# Patient Record
Sex: Male | Born: 1960 | Race: White | Hispanic: No | Marital: Married | Smoking: Former smoker
Health system: Southern US, Community
[De-identification: ages and names within clinical notes are randomized; demographics above are authoritative.]

## PROBLEM LIST (undated history)

## (undated) DIAGNOSIS — R519 Headache, unspecified: Secondary | ICD-10-CM

## (undated) DIAGNOSIS — K219 Gastro-esophageal reflux disease without esophagitis: Secondary | ICD-10-CM

## (undated) DIAGNOSIS — J302 Other seasonal allergic rhinitis: Secondary | ICD-10-CM

## (undated) DIAGNOSIS — M1990 Unspecified osteoarthritis, unspecified site: Secondary | ICD-10-CM

## (undated) DIAGNOSIS — T7840XA Allergy, unspecified, initial encounter: Secondary | ICD-10-CM

## (undated) DIAGNOSIS — T884XXA Failed or difficult intubation, initial encounter: Secondary | ICD-10-CM

## (undated) DIAGNOSIS — G43909 Migraine, unspecified, not intractable, without status migrainosus: Secondary | ICD-10-CM

## (undated) DIAGNOSIS — J189 Pneumonia, unspecified organism: Secondary | ICD-10-CM

## (undated) DIAGNOSIS — R51 Headache: Secondary | ICD-10-CM

## (undated) HISTORY — DX: Headache: R51

## (undated) HISTORY — DX: Migraine, unspecified, not intractable, without status migrainosus: G43.909

## (undated) HISTORY — DX: Headache, unspecified: R51.9

## (undated) HISTORY — DX: Gastro-esophageal reflux disease without esophagitis: K21.9

## (undated) HISTORY — PX: SHOULDER ARTHROSCOPY WITH BICEPS TENDON REPAIR: SHX5674

## (undated) HISTORY — DX: Allergy, unspecified, initial encounter: T78.40XA

## (undated) HISTORY — DX: Other seasonal allergic rhinitis: J30.2

---
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
## 2004-03-19 ENCOUNTER — Ambulatory Visit: Payer: Self-pay | Admitting: Internal Medicine

## 2006-07-28 ENCOUNTER — Ambulatory Visit: Payer: Self-pay | Admitting: Internal Medicine

## 2006-08-06 ENCOUNTER — Ambulatory Visit: Payer: Self-pay | Admitting: Internal Medicine

## 2006-08-06 LAB — CONVERTED CEMR LAB
ALT: 19 units/L (ref 0–53)
AST: 21 units/L (ref 0–37)
Albumin: 4.2 g/dL (ref 3.5–5.2)
Alkaline Phosphatase: 51 units/L (ref 39–117)
BUN: 16 mg/dL (ref 6–23)
Basophils Absolute: 0 10*3/uL (ref 0.0–0.1)
Basophils Relative: 0.8 % (ref 0.0–1.0)
Bilirubin Urine: NEGATIVE
CO2: 29 meq/L (ref 19–32)
Calcium: 9.5 mg/dL (ref 8.4–10.5)
Chloride: 110 meq/L (ref 96–112)
Cholesterol: 201 mg/dL (ref 0–200)
Creatinine, Ser: 1.1 mg/dL (ref 0.4–1.5)
Direct LDL: 111.5 mg/dL
HDL: 61.7 mg/dL (ref >39.0)
Hemoglobin: 15.8 g/dL (ref 13.0–17.0)
Ketones, ur: NEGATIVE mg/dL
Leukocytes, UA: NEGATIVE
MCHC: 34.4 g/dL (ref 30.0–36.0)
Monocytes Relative: 11.2 % — ABNORMAL HIGH (ref 3.0–11.0)
Platelets: 243 10*3/uL (ref 150–400)
Potassium: 3.7 meq/L (ref 3.5–5.1)
RBC: 5.06 M/uL (ref 4.22–5.81)
RDW: 12.2 % (ref 11.5–14.6)
Specific Gravity, Urine: >=1.03 (ref 1.000–1.03)
Total Bilirubin: 1.4 mg/dL — ABNORMAL HIGH (ref 0.3–1.2)
Total CHOL/HDL Ratio: 3.3
Total Protein: 6.7 g/dL (ref 6.0–8.3)
VLDL: 15 mg/dL (ref 0–40)
pH: 6 (ref 5.0–8.0)

## 2006-10-15 ENCOUNTER — Ambulatory Visit: Payer: Self-pay | Admitting: Internal Medicine

## 2007-01-29 HISTORY — PX: CERVICAL DISCECTOMY: SHX98

## 2007-02-19 ENCOUNTER — Observation Stay (HOSPITAL_COMMUNITY): Admission: RE | Admit: 2007-02-19 | Discharge: 2007-02-20 | Payer: Self-pay | Admitting: *Deleted

## 2007-12-14 ENCOUNTER — Ambulatory Visit: Payer: Self-pay | Admitting: Internal Medicine

## 2007-12-14 DIAGNOSIS — R519 Headache, unspecified: Secondary | ICD-10-CM | POA: Insufficient documentation

## 2007-12-14 DIAGNOSIS — J309 Allergic rhinitis, unspecified: Secondary | ICD-10-CM | POA: Insufficient documentation

## 2007-12-14 DIAGNOSIS — R51 Headache: Secondary | ICD-10-CM

## 2008-02-03 ENCOUNTER — Ambulatory Visit: Payer: Self-pay | Admitting: Internal Medicine

## 2009-11-03 IMAGING — CR DG CERVICAL SPINE 2 OR 3 VIEWS
2 series · 2 of 2 positions shown · non-contrast
Comparison: 02/19/2007

CLINICAL DATA: Postop post cervical radiculopathy, ruptured cervical disk. 
 CERVICAL SPINE ? 2 VIEW:

[w c-spine lat]
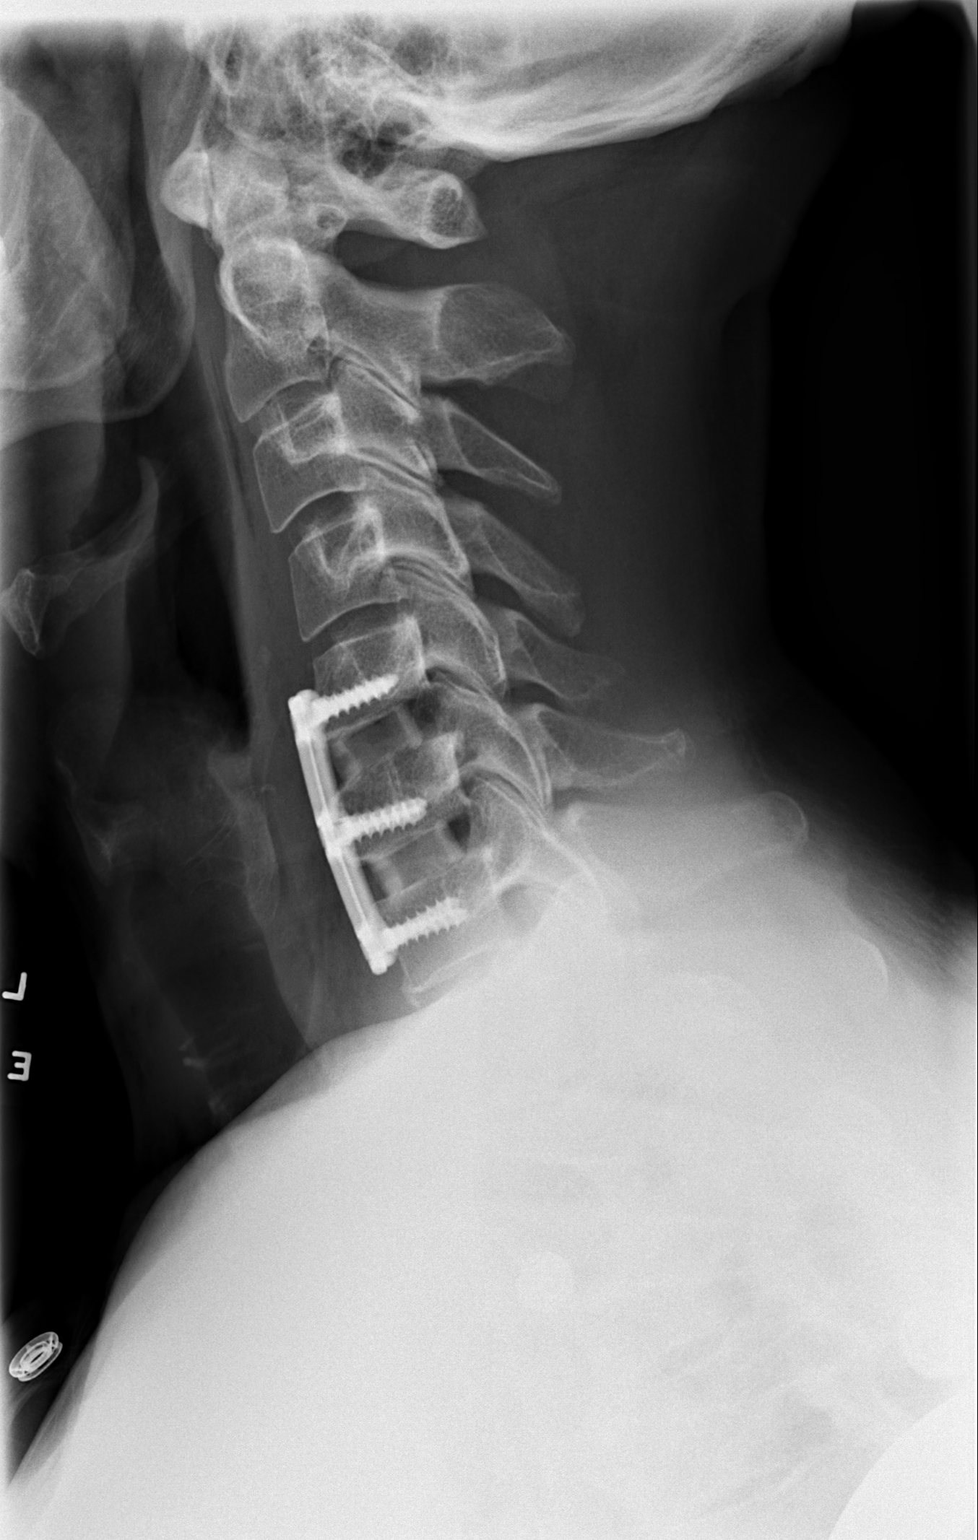

[w c-spine a.p.]
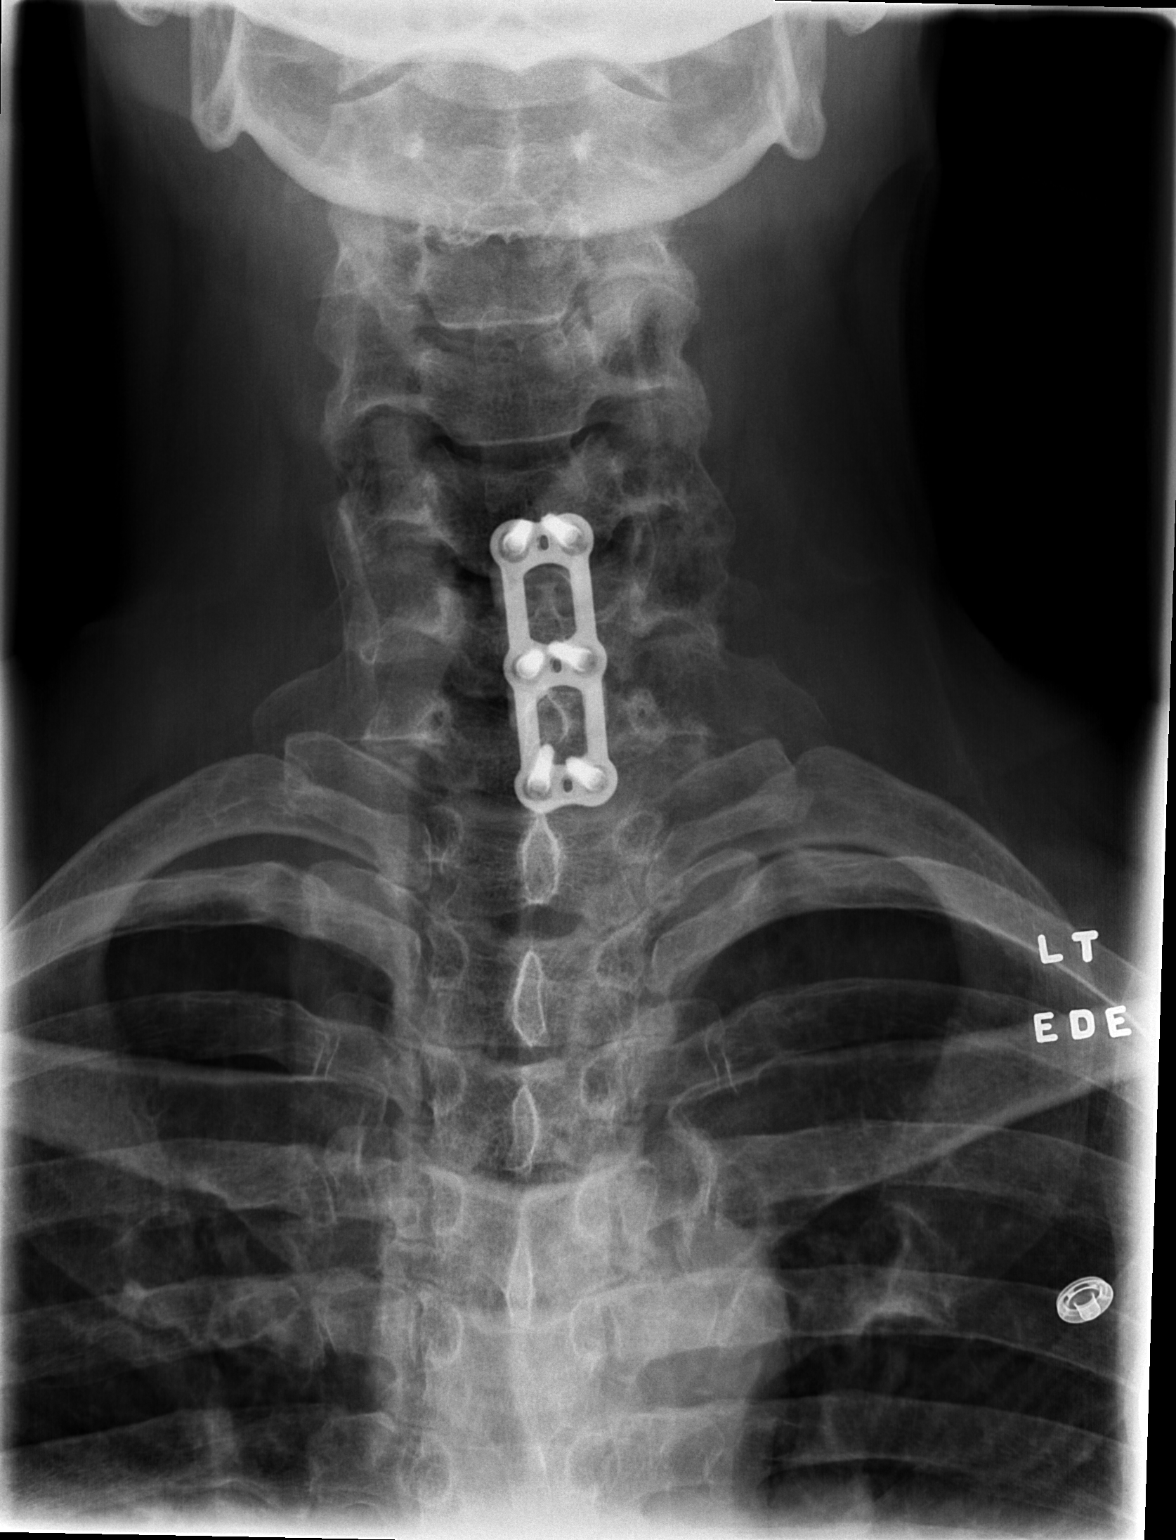

[2 of 2 positions shown; findings below may reference images not displayed]

FINDINGS: Two views of the cervical spine show no acute fracture or subluxation.   The patient is status post anterior metallic fixation of cervical spine from C5 to C7 with a metallic plate and screws noted.  Intervertebral bony spacers are noted in place.  
 The cervical airways appear patent.
IMPRESSION: Status post cervical spine anterior fixation from C5 to C7 with anterior metallic plate and metallic screws in place.    The disk spaces and alignment are preserved.

## 2010-06-12 NOTE — Assessment & Plan Note (Signed)
[facility]                           PRIMARY CARE OFFICE NOTE   NAME:Seth Robinson, Seth Robinson                     MRN:          [id number]  DATE:07/28/2006                            DOB:          [birth year]    CHIEF COMPLAINT:  New patient to the clinic.   HISTORY OF PRESENT ILLNESS:  The patient is a 50 year old white male  here to establish primary care. He last saw Dr.  Stevphen Rochester for  primary care. This was many years ago. He has not had any major chronic  medical problems. He reports periodic neck discomfort for which he has  been seeing a [facility] in [place]. Due to recent exacerbation of  symptoms, he was seen by Dr.  Barnett Abu, neurosurgeon, and he is  currently considering elective cervical fusion. He has been taking  Robaxin and Relafen for the last several weeks. This has improved his  symptoms. However, he has noted elevated blood pressure readings.   He denies any history of major illnesses or hospitalizations. That is,  no history of heart disease, type 2 diabetes.   He had a febrile illness in the early 80s thought to be tick fever or  [facility] spotted fever. He also has had occasional migraines and  remote tonsillectomy in 1975.   CURRENT MEDICATIONS:  1. Robaxin 750 mg every six hours.  2. Relafen 500 mg two tablets a day.   ALLERGIES:  None known.   SOCIAL HISTORY:  The patient is married for the last seven years. 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   FAMILY HISTORY:  Father was diagnosed with prostate cancer in 1966 and  underwent radical prostectomy and also cancer of his vas deferens with  one testicle removed/orchiectomy. Denies any family history of premature  heart disease. No type 2 diabetes.   HABITS:  Occasional alcohol. He averages 2-3 alcoholic beverages per  week. No tobacco use.   PREVENTATIVE CARE HISTORY:  Last tetanus was over 15 years ago. He has  not had  prostate cancer screening.   REVIEW OF SYSTEMS:  Occasional allergy symptoms. No chest pain or  shortness of breath. He does exercise or run 2-3 times per week. Denies  heartburn, nausea, vomiting, diarrhea or constipation. Denies erectile  dysfunction. Denies any dysuria, frequency or urgency. All other systems  negative.   PHYSICAL EXAMINATION:  VITAL SIGNS: Height 5 feet, 10 inches. Weight  195.8 pounds, temperature 98.5, pulse 67, blood pressure 137/84 in the  left arm.  In general, the patient is a pleasant, well-developed, well-nourished 20-  year-old white male who appears his stated age.  HEENT: Normocephalic, atraumatic. Pupils are equal and reactive to light  bilaterally. Extra-ocular muscles were intact. The patient was  anicteric. Conjunctivae was within normal limits. External auditory  canals and tympanic membranes were normal. Hearing is grossly normal.  Oropharyngeal examination was unremarkable.  NECK: Was supple. No adenopathy, carotid bruits or thyromegaly.  CHEST: Normal respiratory effort. Clear to auscultation bilaterally. No  rhonchi, rales or wheezing.  CARDIOVASCULAR: Regular rate and rhythm. No significant  murmurs, rubs or  gallops appreciated.  ABDOMEN: Soft and nontender. Positive bowel sounds. No organomegaly.  Digital rectal examination was deferred today.  MUSCULOSKELETAL: No clubbing, cyanosis or edema. The patient had  palpable pedis and dorsalis pulses.  NEUROLOGIC: Cranial nerves II-XII was grossly intact. He was nonfocal.   BASELINE EKG: Obtained in the office showed normal sinus rhythm at 60  beats per minute. The patient has right ventricular conduction delay. No  ST changes or Q-waves noted.   IMPRESSIONS/RECOMMENDATIONS:  1. Pre-hypertension.  2. History of cervical disc disease for possible elective cervical      fusion C5-C6 and C6-C7.  3. Chronic nonsteroidal anti-inflammatory use.  4. Family history of prostate cancer.  5. Health  maintenance.   RECOMMENDATIONS:  1. The patient was given samples of diclofenac gel and also diclofenac      patch. I prefer the patient use topical agents versus system      NSAIDS.  2. He was advised to monitor his blood pressure at home along with      following a low salt diet. If blood pressure is consistently in the      140s systolically, we discussed treating hypertension.  3. We deferred digital rectal examination and this will be performed      once a baseline PSA has been obtained. He will followup in one      month and review home blood pressure readings.  4. He was updated with tetanus booster today.  5. Would not recommend any other preoperative testing for his upcoming      cervical fusion.     Barbette Hair. Artist Pais, DO  Electronically Signed    RDY/MedQ  DD: 07/28/2006  DT: 07/28/2006  Job #: [id number]

## 2010-06-12 NOTE — Op Note (Signed)
NAME:  Seth Robinson, Seth Robinson              ACCOUNT NO.:  [id number]   MEDICAL RECORD NO.:  [id number]          PATIENT TYPE:  OBV   LOCATION:  3599                         FACILITY:  [facility]   PHYSICIAN:  Stefani Dama, M.D.  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   PROCEDURES:  Anterior cervical decompression, C5-6 and C6-7, arthrodesis  with structural allograft and Alphatec plate fixation.   SURGEON:  Stefani Dama, M.D.   FIRST ASSISTANT:  Hilda Lias, M.D.   ANESTHESIA:  General endotracheal.   INDICATIONS:  Seth Robinson is a 50 year old individual who has had  significant problems with neck, shoulder and arm pain in the right side  of his neck.  I initially evaluated this patient in May and noted that  he had advanced spondylitic changes with severe stenosis, particularly  for the C6 nerve root on the right side.  C7 was also involved with  spondylitic stenosis and there was evidence of cord compression at the  C6-C7 level.  After carefully advising him regarding surgery, the  patient wished to pursue further conservative management which was  essentially ineffective.  He is taken to the operating room now to  undergo surgical decompression.   PROCEDURE:  The patient was brought to the operating room and placed on  the table in the supine position.  After a smooth induction of general  endotracheal anesthesia, he was placed in 5 pounds of halter traction.  The neck was prepped with alcohol and DuraPrep and draped in a sterile  fashion.  Transverse incision was made in the left portion of the neck  and taken down through the platysma.  The  plane between the  sternocleidomastoid and the strap muscles was dissected bluntly until  the prevertebral space was reached.  The first identifiable disk space  was noted to be that of C5-C6 on a localizing radiograph.  Large ventral  osteophytes were encountered and these were carefully rongeured smooth.  The disk space was then opened.  It was noted that the disk space was  quite closed anteriorly secondary to the osteophytic overgrowth and a  high-speed bur and a 2.3 mm dissecting tool was used to drill down an  opening into the disk space and within the disk space.  Then a small  quantity of severely desiccated disk material was encountered at C5-C6.  Dissection was then taken down carefully through the disk space to the  region of the posterior longitudinal ligament.  A large overhanging  osteophyte from the inferior margin of the body of C5 was encountered,  and this was drilled down.  The dissection was then carried out to the  lateral aspect that drilled down the lateral osteophyte and the uncinate  process spur which had grown over this area.  There was  noted be severe  bony stenosis for the exiting C6 nerve root on that left side.  This was  carefully drilled down in a very stepwise fashion, all the time being  careful to protect the undersurface of the exiting foramen.  Once this  area was decompressed, attention was turned to the more central portion  of the canal where again the osteophytic material was encountered and  this was removed.  Most of the osteophyte was from the inferior margin  of the body of C5.  The left-sided foramen was then cleared.  This area  was much easier to clear as there was not much osteophytic overgrowth.  Once the interspace was cleared completely, the endplates were smoothed  with a 5 mm barrel bit and then an 8 mm transgraft was shaped and sized  to the appropriate configuration, filled with demineralized bone matrix  and then placed into the  interspace.  Attention was then turned to C6  and C7.  Retractors were repositioned into this area and again a ventral  osteophyte was encountered, not nearly as severe as the one at C5-6.  Nonetheless, this disk space also required some drilling to enter it  adequately, and once the disk space was opened, a moderate amount of  degenerated disk material was encountered within the disk space.  This  again was noted to be severely desiccated.  The endplates were cleared  of this material and the interspace was then opened further and again on  the right side mostly osteophytic overgrown material was found near the  region of the foramen.  This foramen was opened in the same very  stepwise fashion, drilling away the bone, and using a 1 and then 2 mm  Kerrison punch to gradually remove small spicules of bone and redundant  ligament to decompress the right-sided C7 nerve root.  Once this was  completed, the left side was decompressed in a similar fashion.  The  interspace was then smoothed with the 5 mm barrel bit, and again a  standard 8 mm graft was fashioned and fit into the interspace and filled  with demineralized bone matrix.  Traction was removed at this time and a  37 mm standard size Alphatec plate of the Trestle variety was placed and  secured with variable 4 mm x 14 mm  bone screws.  These were secured and  final localizing radiograph identified good position and surgical  construct.  Hemostasis was then carefully obtained in the soft tissues  and once this was verified, the platysma was closed with 3-0 Vicryl in  interrupted fashion, 3-0 Vicryl was used in the subcuticular tissue.  Blood loss was estimated at less than 50 mL.  The patient tolerated the  procedure well and was returned to the recovery room in stable  condition.      Stefani Dama, M.D.  Electronically Signed     HJE/MEDQ  D:  02/19/2007  T:  02/19/2007  Job:  [id number]

## 2010-10-18 LAB — CBC
HCT: 44.9
Hemoglobin: 15.4
MCHC: 34.2
RBC: 4.99

## 2012-12-28 ENCOUNTER — Encounter: Payer: Self-pay | Admitting: Family Medicine

## 2012-12-28 ENCOUNTER — Ambulatory Visit (INDEPENDENT_AMBULATORY_CARE_PROVIDER_SITE_OTHER): Payer: 59 | Admitting: Family Medicine

## 2012-12-28 VITALS — BP 120/80 | Temp 98.5°F | Ht 70.0 in | Wt 192.0 lb

## 2012-12-28 DIAGNOSIS — Z7689 Persons encountering health services in other specified circumstances: Secondary | ICD-10-CM

## 2012-12-28 DIAGNOSIS — R0609 Other forms of dyspnea: Secondary | ICD-10-CM

## 2012-12-28 DIAGNOSIS — R0683 Snoring: Secondary | ICD-10-CM

## 2012-12-28 DIAGNOSIS — Z7189 Other specified counseling: Secondary | ICD-10-CM

## 2012-12-28 DIAGNOSIS — M2240 Chondromalacia patellae, unspecified knee: Secondary | ICD-10-CM

## 2012-12-28 DIAGNOSIS — M2242 Chondromalacia patellae, left knee: Secondary | ICD-10-CM

## 2012-12-28 NOTE — Patient Instructions (Signed)
-  do exercises provided  -check with dentist about mouth pieces for snoring and aveo  -PLEASE SIGN UP FOR MYCHART TODAY   We recommend the following healthy lifestyle measures: - eat a healthy diet consisting of lots of vegetables, fruits, beans, nuts, seeds, healthy meats such as white chicken and fish and whole grains.  - avoid fried foods, fast food, processed foods, sodas, red meet and other fattening foods.  - get a least 150 minutes of aerobic exercise per week.   Follow up in: 1-2 months for physical

## 2012-12-28 NOTE — Progress Notes (Signed)
Pre visit review using our clinic review tool, if applicable. No additional management support is needed unless otherwise documented below in the visit note. 

## 2012-12-28 NOTE — Progress Notes (Signed)
Chief Complaint  Patient presents with  . Establish Care    HPI:  CARMIN DIBARTOLO is here to establish care.  Last PCP and physical:  Has the following chronic problems and concerns today:  Knee P Rt: -flares from time to time - reports it is runners knee -tenderness in R patellar tendon, lat patellar region worse with bending knee, running -denies: weakness, giving out, fevers, malasie, numbness  Snoring: -his whole life -no daytime somnolence, sleeps well, feels rested -wonders about mouth pieces  Patient Active Problem List   Diagnosis Date Noted  . ALLERGIC RHINITIS 12/14/2007  . HEADACHE, CHRONIC 12/14/2007   Health Maintenance: -had flu vaccine  ROS: See pertinent positives and negatives per HPI.  Past Medical History  Diagnosis Date  . Frequent headaches   . GERD (gastroesophageal reflux disease)   . Seasonal allergies   . Migraines     Family History  Problem Relation Age of Onset  . Lung cancer Maternal Grandfather   . Prostate cancer Father   . Heart disease Maternal Grandfather   . Kidney disease Paternal Uncle   . Testicular cancer Father     History   Social History  . Marital Status: Married    Spouse Name: N/A    Number of Children: N/A  . Years of Education: N/A   Social History Main Topics  . Smoking status: Never Smoker   . Smokeless tobacco: None  . Alcohol Use: Yes     Comment: 1 beer about 5 times during the week   . Drug Use: None  . Sexual Activity: None   Other Topics Concern  . 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  GENERAL: vitals reviewed and listed above, alert, oriented, appears well hydrated and in no acute distress  HEENT: atraumatic, conjunttiva clear, no obvious abnormalities on inspection of external nose and ears  NECK: no obvious masses on inspection  LUNGS: clear to auscultation bilaterally, no wheezes, rales or rhonchi, good air movement  CV: HRRR, no peripheral edema  MS: moves all extremities without noticeable abnormality -TTP patellar tendon mild, mild VMO atrophy, o/w normal knee exam  PSYCH: pleasant and cooperative, no obvious depression or anxiety  ASSESSMENT AND PLAN:  Discussed the following assessment and plan:  Runner's knee, left  Encounter to establish care  Snoring  -We reviewed the PMH, PSH, FH, SH, Meds and Allergies. -We provided refills for any medications we will prescribe as needed. -We addressed current concerns per orders and patient instructions. -We have asked for records for pertinent exams, studies, vaccines and notes from previous providers. -We have advised patient to follow up per instructions below. -follow up physical and follow up in 1-2 months   -Patient advised to return or notify a doctor immediately if symptoms worsen or persist or new concerns arise.  Patient  Instructions  -do exercises provided  -check with dentist about mouth pieces for snoring and aveo  -PLEASE SIGN UP FOR MYCHART TODAY   We recommend the following healthy lifestyle measures: - eat a healthy diet consisting of lots of vegetables, fruits, beans, nuts, seeds, healthy meats such as white chicken and fish and whole grains.  - avoid fried foods, fast food, processed foods, sodas, red meet and other fattening foods.  - get a least 150 minutes of aerobic exercise per week.   Follow up in: 1-2 months for physical      KIM, Dahlia Client  R.

## 2013-01-28 HISTORY — PX: COLONOSCOPY: SHX174

## 2013-03-03 ENCOUNTER — Encounter: Payer: Self-pay | Admitting: Family Medicine

## 2013-03-03 ENCOUNTER — Ambulatory Visit (INDEPENDENT_AMBULATORY_CARE_PROVIDER_SITE_OTHER): Payer: 59 | Admitting: Family Medicine

## 2013-03-03 VITALS — BP 128/86 | Temp 98.0°F | Wt 190.0 lb

## 2013-03-03 DIAGNOSIS — Z8042 Family history of malignant neoplasm of prostate: Secondary | ICD-10-CM

## 2013-03-03 DIAGNOSIS — Z1211 Encounter for screening for malignant neoplasm of colon: Secondary | ICD-10-CM

## 2013-03-03 DIAGNOSIS — Z0000 Encounter for general adult medical examination without abnormal findings: Secondary | ICD-10-CM

## 2013-03-03 DIAGNOSIS — M7710 Lateral epicondylitis, unspecified elbow: Secondary | ICD-10-CM

## 2013-03-03 DIAGNOSIS — M2240 Chondromalacia patellae, unspecified knee: Secondary | ICD-10-CM

## 2013-03-03 LAB — LIPID PANEL
CHOLESTEROL: 175 mg/dL (ref 0–200)
HDL: 69.3 mg/dL (ref >39.00)
LDL Cholesterol: 91 mg/dL (ref 0–99)
Total CHOL/HDL Ratio: 3
Triglycerides: 76 mg/dL (ref 0.0–149.0)
VLDL: 15.2 mg/dL (ref 0.0–40.0)

## 2013-03-03 LAB — HEMOGLOBIN A1C: HEMOGLOBIN A1C: 5.5 % (ref 4.6–6.5)

## 2013-03-03 LAB — PSA: PSA: 0.9 ng/mL (ref 0.10–4.00)

## 2013-03-03 NOTE — Patient Instructions (Signed)
-  We have ordered labs or studies at this visit. It can take up to 1-2 weeks for results and processing. We will contact you with instructions IF your results are abnormal. Normal results will be released to your [facility]. If you have not heard from us or can not find your results in [facility] in 2 weeks please contact our office.  -We placed a referral for you as discussed to the gastroenterologist for your colonoscopy. It usually takes about 1-2 weeks to process and schedule this referral. If you have not heard from us regarding this appointment in 2 weeks please contact our office.  -PLEASE SIGN UP FOR MYCHART TODAY   We recommend the following healthy lifestyle measures: - eat a healthy diet consisting of lots of vegetables, fruits, beans, nuts, seeds, healthy meats such as white chicken and fish and whole grains.  - avoid fried foods, fast food, processed foods, sodas, red meet and other fattening foods.  - get a least 150 minutes of aerobic exercise per week.   Follow up in: 1 year and as needed

## 2013-03-03 NOTE — Progress Notes (Signed)
Chief Complaint  Patient presents with  . Annual Exam    [facility]    HPI:  Here for his annual exam:  Runners knee: -had been doing exercises and improved and then stopped exercise -not running now  Tennis elbow: -trouble with this on and off and saw ortho in the past and had inj  ASA: low risks and no bleeding hx  Colon cancer screening: never had colon cancer screening  No hx HTN  Lipid/Diabetes screening: has not done in a long time, will screen today  STI screening/Hep C: not interested  Depression/substance abuse: none  Diet and exercise: not exercising now as mother had stroke and had been at hospital with her recently; diet is so so  Prostate cancer screening: pt asked about this, wants to check PSA after discussion risks and benefit as father had prostate ca at age 29   ROS: Review of Systems  Constitutional: Negative for fever and malaise/fatigue.  HENT: Positive for congestion. Negative for ear pain and hearing loss.   Eyes: Negative for blurred vision and double vision.  Respiratory: Negative for cough and shortness of breath.   Cardiovascular: Negative for chest pain and leg swelling.  Gastrointestinal: Negative for blood in stool and melena.  Genitourinary: Negative for dysuria.  Musculoskeletal: Negative for falls.  Skin: Negative for rash.  Neurological: Negative for dizziness and seizures.  Endo/Heme/Allergies: Does not bruise/bleed easily.  Psychiatric/Behavioral: Negative for depression. The patient does not have insomnia.      Past Medical History  Diagnosis Date  . Frequent headaches   . GERD (gastroesophageal reflux disease)   . Seasonal allergies   . Migraines     Past Surgical History  Procedure Laterality Date  . Cervical discectomy  Jan 2009    c5-c6,c6-c7  . Adenoidectomy      Family History  Problem Relation Age of Onset  . Lung cancer Maternal Grandfather   . Prostate cancer Father   . Heart  disease Maternal Grandfather   . Kidney disease Paternal Uncle   . Testicular cancer Father     History   Social History  . Marital Status: Married    Spouse Name: N/A    Number of Children: N/A  . Years of Education: N/A   Social History Main Topics  . Smoking status: Never Smoker   . Smokeless tobacco: None  . Alcohol Use: Yes     Comment: 1 beer about 5 times during the week   . Drug Use: None  . Sexual Activity: None   Other Topics Concern  . 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  GENERAL: vitals reviewed and listed above, alert, oriented, appears well hydrated and in no acute distress  HEENT: atraumatic, conjunttiva clear, no obvious abnormalities on inspection of external nose and ears  NECK: no  obvious masses on inspection  LUNGS: clear to auscultation bilaterally, no wheezes, rales or rhonchi, good air movement  CV: HRRR, no peripheral edema  MS: moves all extremities without noticeable abnormality  PSYCH: pleasant and cooperative, no obvious depression or anxiety  GU/Rectal: deferred  SKIN: no abnormal lesions  ASSESSMENT AND PLAN:  Discussed the following assessment and plan:  Colon cancer screening - Plan: Ambulatory referral to Gastroenterology  Encounter for preventive health examination - Plan: Lipid Panel, Hemoglobin A1c  Family hx of prostate cancer - Plan: PSA  Runner's knee  Tennis elbow  -discussed all USPSHTF level  A and B recommendations -FASTING labs today -advised to keep up on exercises for knee and ease back into running as pain has resolved -discussed options for tennis elbow and he may consider seeing sports med or ortho about this and will call -discussed risks and benefits of PSA per his request and he wishes to do this -recs and plan per orders and instructions  -Patient advised to return or notify a doctor immediately if symptoms worsen or persist or new concerns arise.  Patient Instructions  -We have ordered labs or studies at this visit. It can take up to 1-2 weeks for results and processing. We will contact you with instructions IF your results are abnormal. Normal results will be released to your [facility]. If you have not heard from us or can not find your results in [facility] in 2 weeks please contact our office.  -We placed a referral for you as discussed to the gastroenterologist for your colonoscopy. It usually takes about 1-2 weeks to process and schedule this referral. If you have not heard from us regarding this appointment in 2 weeks please contact our office.  -PLEASE SIGN UP FOR MYCHART TODAY   We recommend the following healthy lifestyle measures: - eat a healthy diet consisting of lots of vegetables, fruits, beans, nuts, seeds, healthy meats such as white chicken and fish and whole grains.  - avoid fried foods, fast food, processed foods, sodas, red meet and other fattening foods.  - get a least 150 minutes of aerobic exercise per week.   Follow up in: 1 year and as needed      Ashtyn Meland, Dahlia ClientHANNAH R.

## 2013-03-03 NOTE — Progress Notes (Signed)
Pre visit review using our clinic review tool, if applicable. No additional management support is needed unless otherwise documented below in the visit note. 

## 2013-03-18 ENCOUNTER — Encounter: Payer: Self-pay | Admitting: Gastroenterology

## 2013-04-28 ENCOUNTER — Ambulatory Visit (AMBULATORY_SURGERY_CENTER): Payer: Self-pay | Admitting: *Deleted

## 2013-04-28 VITALS — Ht 70.0 in | Wt 194.8 lb

## 2013-04-28 DIAGNOSIS — Z1211 Encounter for screening for malignant neoplasm of colon: Secondary | ICD-10-CM

## 2013-04-28 MED ORDER — MOVIPREP 100 G PO SOLR: ORAL | Status: DC

## 2013-04-28 NOTE — Progress Notes (Signed)
No allergies to eggs or soy. No problems with anesthesia.  Pt given Emmi instructions for colonoscopy  

## 2013-05-03 ENCOUNTER — Encounter: Payer: Self-pay | Admitting: Gastroenterology

## 2013-05-12 ENCOUNTER — Encounter: Payer: Self-pay | Admitting: Gastroenterology

## 2013-05-12 ENCOUNTER — Ambulatory Visit (AMBULATORY_SURGERY_CENTER): Payer: 59 | Admitting: Gastroenterology

## 2013-05-12 VITALS — BP 118/78 | HR 53 | Temp 97.9°F | Resp 12 | Ht 70.0 in | Wt 194.0 lb

## 2013-05-12 DIAGNOSIS — K644 Residual hemorrhoidal skin tags: Secondary | ICD-10-CM

## 2013-05-12 DIAGNOSIS — K5730 Diverticulosis of large intestine without perforation or abscess without bleeding: Secondary | ICD-10-CM

## 2013-05-12 DIAGNOSIS — Z1211 Encounter for screening for malignant neoplasm of colon: Secondary | ICD-10-CM

## 2013-05-12 MED ORDER — SODIUM CHLORIDE 0.9 % IV SOLN: 500.0000 mL | INTRAVENOUS | Status: DC

## 2013-05-12 NOTE — Op Note (Signed)
[facility] [address] [place], [id number]   COLONOSCOPY PROCEDURE REPORT  PATIENT: [name], Seth A.  MR#: [id number] BIRTHDATE: May 09, 1960 , 52  yrs. old GENDER: Male ENDOSCOPIST: Rachael Feeaniel P Jacobs, MD REFERRED XL:KGMWNUBY:Hannah Selena BattenKim, D.O. PROCEDURE DATE:  05/12/2013 PROCEDURE:   Colonoscopy, screening First Screening Colonoscopy - Avg.  risk and is 50 yrs.  old or older Yes.  Prior Negative Screening - Now for repeat screening. N/A  History of Adenoma - Now for follow-up colonoscopy & has been > or = to 3 yrs.  N/A  Polyps Removed Today? No.  Recommend repeat exam, <10 yrs? No. ASA CLASS:   Class II INDICATIONS:average risk screening. MEDICATIONS: MAC sedation, administered by CRNA and propofol (Diprivan) 350mg  IV  DESCRIPTION OF PROCEDURE:   After the risks benefits and alternatives of the procedure were thoroughly explained, informed consent was obtained.  A digital rectal exam revealed no abnormalities of the rectum.   The LB [id number]  endoscope was introduced through the anus and advanced to the cecum, which was identified by both the appendix and ileocecal valve. No adverse events experienced.   The quality of the prep was excellent.  The instrument was then slowly withdrawn as the colon was fully examined.   COLON FINDINGS: There were numerous diverticulum in the left colon. There were small external hemorrhoids.  The examination was otherwise normal.  Retroflexed views revealed no abnormalities. The time to cecum=6 minutes 48 seconds.  Withdrawal time=8 minutes 15 seconds.  The scope was withdrawn and the procedure completed. COMPLICATIONS: There were no complications.  ENDOSCOPIC IMPRESSION: There were numerous diverticulum in the left colon. There were small external hemorrhoids. The examination was otherwise normal.  No polyps or cancers  RECOMMENDATIONS: You should continue to follow colorectal cancer screening guidelines for "routine  risk" patients with a repeat colonoscopy in 10 years.    eSigned:  Rachael Feeaniel P Jacobs, MD 05/12/2013 9:56 AM

## 2013-05-12 NOTE — Patient Instructions (Signed)
Discharge instructions given with verbal understanding. Handouts on diverticulosis and hemorrhoids. Resume previous medications. YOU HAD AN ENDOSCOPIC PROCEDURE TODAY AT THE  ENDOSCOPY CENTER: Refer to the procedure report that was given to you for any specific questions about what was found during the examination.  If the procedure report does not answer your questions, please call your gastroenterologist to clarify.  If you requested that your care partner not be given the details of your procedure findings, then the procedure report has been included in a sealed envelope for you to review at your convenience later.  YOU SHOULD EXPECT: Some feelings of bloating in the abdomen. Passage of more gas than usual.  Walking can help get rid of the air that was put into your GI tract during the procedure and reduce the bloating. If you had a lower endoscopy (such as a colonoscopy or flexible sigmoidoscopy) you may notice spotting of blood in your stool or on the toilet paper. If you underwent a bowel prep for your procedure, then you may not have a normal bowel movement for a few days.  DIET: Your first meal following the procedure should be a light meal and then it is ok to progress to your normal diet.  A half-sandwich or bowl of soup is an example of a good first meal.  Heavy or fried foods are harder to digest and may make you feel nauseous or bloated.  Likewise meals heavy in dairy and vegetables can cause extra gas to form and this can also increase the bloating.  Drink plenty of fluids but you should avoid alcoholic beverages for 24 hours.  ACTIVITY: Your care partner should take you home directly after the procedure.  You should plan to take it easy, moving slowly for the rest of the day.  You can resume normal activity the day after the procedure however you should NOT DRIVE or use heavy machinery for 24 hours (because of the sedation medicines used during the test).    SYMPTOMS TO REPORT  IMMEDIATELY: A gastroenterologist can be reached at any hour.  During normal business hours, 8:30 AM to 5:00 PM Monday through Friday, call [phone].  After hours and on weekends, please call the GI answering service at [phone] who will take a message and have the physician on call contact you.   Following lower endoscopy (colonoscopy or flexible sigmoidoscopy):  Excessive amounts of blood in the stool  Significant tenderness or worsening of abdominal pains  Swelling of the abdomen that is new, acute  Fever of 100F or higher  FOLLOW UP: If any biopsies were taken you will be contacted by phone or by letter within the next 1-3 weeks.  Call your gastroenterologist if you have not heard about the biopsies in 3 weeks.  Our staff will call the home number listed on your records the next business day following your procedure to check on you and address any questions or concerns that you may have at that time regarding the information given to you following your procedure. This is a courtesy call and so if there is no answer at the home number and we have not heard from you through the emergency physician on call, we will assume that you have returned to your regular daily activities without incident.  SIGNATURES/CONFIDENTIALITY: You and/or your care partner have signed paperwork which will be entered into your electronic medical record.  These signatures attest to the fact that that the information above on your After Visit Summary   has been reviewed and is understood.  Full responsibility of the confidentiality of this discharge information lies with you and/or your care-partner. 

## 2013-05-12 NOTE — Progress Notes (Signed)
Lidocaine-40mg IV prior to Propofol InductionPropofol given over incremental dosages 

## 2013-05-13 ENCOUNTER — Telehealth: Payer: Self-pay

## 2013-05-13 NOTE — Telephone Encounter (Signed)
Called [phone] the phone rang numerous times but no answering machine picked up.  Unable to leave a message that we called. maw

## 2014-03-03 ENCOUNTER — Encounter: Payer: Self-pay | Admitting: Family Medicine

## 2014-03-03 ENCOUNTER — Ambulatory Visit (INDEPENDENT_AMBULATORY_CARE_PROVIDER_SITE_OTHER): Payer: 59 | Admitting: Family Medicine

## 2014-03-03 VITALS — BP 110/70 | HR 71 | Temp 98.3°F | Ht 69.5 in | Wt 196.5 lb

## 2014-03-03 DIAGNOSIS — Z0000 Encounter for general adult medical examination without abnormal findings: Secondary | ICD-10-CM

## 2014-03-03 LAB — LIPID PANEL
CHOLESTEROL: 172 mg/dL (ref 0–200)
HDL: 67.7 mg/dL (ref >39.00)
LDL CALC: 83 mg/dL (ref 0–99)
NonHDL: 104.3
TRIGLYCERIDES: 109 mg/dL (ref 0.0–149.0)
Total CHOL/HDL Ratio: 3
VLDL: 21.8 mg/dL (ref 0.0–40.0)

## 2014-03-03 LAB — HEMOGLOBIN A1C: HEMOGLOBIN A1C: 5.4 % (ref 4.6–6.5)

## 2014-03-03 NOTE — Progress Notes (Signed)
Pre visit review using our clinic review tool, if applicable. No additional management support is needed unless otherwise documented below in the visit note. 

## 2014-03-03 NOTE — Progress Notes (Signed)
HPI:  Here for CPE:  -Concerns and/or follow up today: none  -Diet: variety of foods, balance and well rounded, larger portion sizes  -Exercise: no regular exercise  -Diabetes and Dyslipidemia Screening: FASTING today  -Hx of HTN: no  -Vaccines: UTD  -sexual activity: yes, male partner, no new partners  -wants STI testing, Hep C screening (if born [id number]): no  -FH colon or prstate ca: see FH Last colon cancer screening: UTD Last prostate ca screening: done last year, offered after discussion risks benefits, opted to screen every 2 years with PSA per current data  -Alcohol, Tobacco, drug use: see social history  Review of Systems - no fevers, unintentional weight loss, vision loss, hearing loss, chest pain, sob, hemoptysis, melena, hematochezia, hematuria, genital discharge, changing or concerning skin lesions, bleeding, bruising, loc, thoughts of self harm or SI  Past Medical History  Diagnosis Date  . Frequent headaches   . GERD (gastroesophageal reflux disease)   . Seasonal allergies   . Migraines     Past Surgical History  Procedure Laterality Date  . Cervical discectomy  Jan 2009    c5-c6,c6-c7  . Adenoidectomy  1970    Family History  Problem Relation Age of Onset  . Lung cancer Maternal Grandfather   . Heart disease Maternal Grandfather   . Prostate cancer Father   . Testicular cancer Father   . Kidney disease Paternal Uncle   . Colon cancer Neg Hx     History   Social History  . Marital Status: Married    Spouse Name: N/A    Number of Children: N/A  . Years of Education: N/A   Social History Main Topics  . Smoking status: Never Smoker   . Smokeless tobacco: Never Used  . Alcohol Use: 3.0 oz/week    5 Cans of beer per week  . Drug Use: No  . Sexual Activity: None   Other Topics Concern  . None   Social History Narrative   Work or School: retired Building services engineerpolice officer      Home Situation: lives with wife      Spiritual Beliefs:  christian      Lifestyle: runs when warm; diet ok              Current outpatient prescriptions:  Marland Kitchen.  Multiple Vitamins-Minerals (GNP MEGA MULTI FOR MEN) TABS, Take by mouth., Disp: , Rfl:  .  Omega-3 Fatty Acids (FISH OIL) 1200 MG CAPS, Take by mouth daily., Disp: , Rfl:  .  triamcinolone (NASACORT ALLERGY 24HR) 55 MCG/ACT AERO nasal inhaler, Place 2 sprays into the nose daily., Disp: , Rfl:   EXAM:  Filed Vitals:   03/03/14 0848  BP: 110/70  Pulse: 71  Temp: 98.3 F (36.8 C)  TempSrc: Oral  Height: 5' 9.5" (1.765 m)  Weight: 196 lb 8 oz (89.132 kg)    Estimated body mass index is 28.61 kg/(m^2) as calculated from the following:   Height as of this encounter: 5' 9.5" (1.765 m).   Weight as of this encounter: 196 lb 8 oz (89.132 kg).  GENERAL: vitals reviewed and listed below, alert, oriented, appears well hydrated and in no acute distress  HEENT: head atraumatic, PERRLA, normal appearance of eyes, ears, nose and mouth. moist mucus membranes.  NECK: supple, no masses or lymphadenopathy  LUNGS: clear to auscultation bilaterally, no rales, rhonchi or wheeze  CV: HRRR, no peripheral edema or cyanosis, normal pedal pulses  ABDOMEN: bowel sounds normal, soft, non tender to palpation,  no masses, no rebound or guarding  GU: declined  RECTAL:  declined  SKIN: no rash or abnormal lesions  MS: normal gait, moves all extremities normally  NEURO: CN II-XII grossly intact, normal muscle strength and sensation to light touch on extremities  PSYCH: normal affect, pleasant and cooperative  ASSESSMENT AND PLAN:  Discussed the following assessment and plan:  There are no diagnoses linked to this encounter.  Visit for preventive health examination - Plan: Lipid panel, Hemoglobin A1c  -Discussed and advised all [place] preventive services health task force level A and B recommendations for age, sex and risks.  -Advised at least 150 minutes of exercise per week and a healthy  diet low in saturated fats and sweets and consisting of fresh fruits and vegetables, lean meats such as fish and white chicken and whole grains.  -FASTING labs, studies and vaccines per orders this encounter   Patient advised to return to clinic immediately if symptoms worsen or persist or new concerns.  There are no Patient Instructions on file for this visit.  No Follow-up on file.   Seth Basque R.

## 2015-03-06 ENCOUNTER — Encounter: Payer: 59 | Admitting: Family Medicine

## 2015-04-25 ENCOUNTER — Encounter (HOSPITAL_COMMUNITY): Payer: Self-pay | Admitting: Emergency Medicine

## 2015-04-25 ENCOUNTER — Emergency Department (HOSPITAL_COMMUNITY)
Admission: EM | Admit: 2015-04-25 | Discharge: 2015-04-26 | Disposition: A | Discharge status: Home / Self Care | Payer: 59 | Attending: Emergency Medicine | Admitting: Emergency Medicine

## 2015-04-25 DIAGNOSIS — Y998 Other external cause status: Secondary | ICD-10-CM | POA: Diagnosis not present

## 2015-04-25 DIAGNOSIS — S3992XA Unspecified injury of lower back, initial encounter: Secondary | ICD-10-CM | POA: Diagnosis present

## 2015-04-25 DIAGNOSIS — Y9389 Activity, other specified: Secondary | ICD-10-CM | POA: Diagnosis not present

## 2015-04-25 DIAGNOSIS — Z79899 Other long term (current) drug therapy: Secondary | ICD-10-CM | POA: Insufficient documentation

## 2015-04-25 DIAGNOSIS — Y9289 Other specified places as the place of occurrence of the external cause: Secondary | ICD-10-CM | POA: Insufficient documentation

## 2015-04-25 DIAGNOSIS — Z8719 Personal history of other diseases of the digestive system: Secondary | ICD-10-CM | POA: Diagnosis not present

## 2015-04-25 DIAGNOSIS — G43909 Migraine, unspecified, not intractable, without status migrainosus: Secondary | ICD-10-CM | POA: Insufficient documentation

## 2015-04-25 DIAGNOSIS — S39012A Strain of muscle, fascia and tendon of lower back, initial encounter: Secondary | ICD-10-CM

## 2015-04-25 DIAGNOSIS — X58XXXA Exposure to other specified factors, initial encounter: Secondary | ICD-10-CM | POA: Insufficient documentation

## 2015-04-25 MED ORDER — KETOROLAC TROMETHAMINE 30 MG/ML IJ SOLN
30.0000 mg | Freq: Once | INTRAMUSCULAR | Status: AC
  Administered 2015-04-25: 30 mg via INTRAVENOUS
  Filled 2015-04-25: qty 1

## 2015-04-25 NOTE — ED Notes (Signed)
Pt states that he started c/o back pain tonight after hearing a pop in the shower today. Took pain meds he had at home w/o relief. Alert and oriented. 4mg  morphine given en route. 18g LFA.

## 2015-04-25 NOTE — ED Provider Notes (Signed)
CSN: [id number]     Arrival date & time 04/25/15  1959 History   First MD Initiated Contact with Patient 04/25/15 2306     Chief Complaint  Patient presents with  . Back Pain     (Consider location/radiation/quality/duration/timing/severity/associated sxs/prior Treatment) HPI   Seth Robinson is a 55 year old male with history of migraines, GERD who presents to the ED complaining of back pain. Patient states that this morning when he was in the shower he bent over and felt sudden onset of low back pain. Patient states that he took 2 Aleve and felt a little better and was able to go to work today. He has been ambulatory since that time. However, when he got home this evening while sitting down he states his pain intensified and he was unable to walk due to the pain. Patient states that he has had back pain similar to this before. He denies numbness or tingling in extremity, bowel or bladder incontinence, fever, dysuria, history of IVDA.  Past Medical History  Diagnosis Date  . Frequent headaches   . GERD (gastroesophageal reflux disease)   . Seasonal allergies   . Migraines    Past Surgical History  Procedure Laterality Date  . Cervical discectomy  Jan 2009    c5-c6,c6-c7  . Adenoidectomy  1970   Family History  Problem Relation Age of Onset  . Lung cancer Maternal Grandfather   . Heart disease Maternal Grandfather   . Prostate cancer Father   . Testicular cancer Father   . Kidney disease Paternal Uncle   . Colon cancer Neg Hx    Social History  Substance Use Topics  . Smoking status: Never Smoker   . Smokeless tobacco: Never Used  . Alcohol Use: 3.0 oz/week    5 Cans of beer per week    Review of Systems  All other systems reviewed and are negative.     Allergies  Review of patient's allergies indicates no known allergies.  Home Medications   Prior to Admission medications   Medication Sig Start Date End Date Taking? Authorizing Provider    aspirin-acetaminophen-caffeine (EXCEDRIN MIGRAINE) 4122033387250-250-65 MG tablet Take 2 tablets by mouth every 6 (six) hours as needed for headache.   Yes Historical Provider, MD  cyclobenzaprine (AMRIX) 15 MG 24 hr capsule Take 15 mg by mouth daily as needed for muscle spasms.   Yes Historical Provider, MD  ibuprofen (ADVIL,MOTRIN) 200 MG tablet Take 600 mg by mouth every 6 (six) hours as needed for moderate pain.   Yes Historical Provider, MD  Multiple Vitamins-Minerals (GNP MEGA MULTI FOR MEN) TABS Take 1 tablet by mouth daily.    Yes Historical Provider, MD  naproxen sodium (ANAPROX) 220 MG tablet Take 440 mg by mouth 2 (two) times daily as needed (back pain).   Yes Historical Provider, MD  Omega-3 Fatty Acids (FISH OIL) 1200 MG CAPS Take 1 capsule by mouth daily.    Yes Historical Provider, MD   BP 136/82 mmHg  Pulse 89  Temp(Src) 98.3 F (36.8 C) (Oral)  Resp 16  SpO2 97% Physical Exam  Constitutional: He is oriented to person, place, and time. He appears well-developed and well-nourished. No distress.  HENT:  Head: Normocephalic and atraumatic.  Eyes: Conjunctivae are normal. Right eye exhibits no discharge. Left eye exhibits no discharge. No scleral icterus.  Cardiovascular: Normal rate.   Pulmonary/Chest: Effort normal.  Musculoskeletal:       Lumbar back: He exhibits normal range of motion, no bony  tenderness, no swelling and no deformity.       Back:  No midline spinal tenderness. FROM on C, T, L spine. No step off or obvious bony deformities.   Neurological: He is alert and oriented to person, place, and time. Coordination normal.  Strength 5/5 throughout. No sensory deficits.  No gait abnormality.  Skin: Skin is warm and dry. No rash noted. He is not diaphoretic. No erythema. No pallor.  Psychiatric: He has a normal mood and affect. His behavior is normal.  Nursing note and vitals reviewed.   ED Course  Procedures (including critical care time) Labs Review Labs Reviewed - No  data to display  Imaging Review No results found. I have personally reviewed and evaluated these images and lab results as part of my medical decision-making.   EKG Interpretation None      MDM   Final diagnoses:  Lumbar strain, initial encounter    Patient with low back pain after bending over in shower. This is similar to pts previous back pain/lumbar strains.  No neurological deficits and normal neuro exam.  Patient is ambulatory in ED. No loss of bowel or bladder control.  No concern for cauda equina.  No fever, night sweats, weight loss, h/o cancer, IVDU. No midline spinal tenderness. No advanced imaging necessary at this time. Pain adequately managed in ED. RICE protocol and pain medicine indicated and discussed with patient. Return precautions outlined in patient discharge instructions.       Lester Kinsman Arco, PA-C 04/27/15 1439  Gilda Crease, MD 04/28/15 0700

## 2015-04-26 MED ORDER — CYCLOBENZAPRINE HCL 10 MG PO TABS: 10.0000 mg | ORAL_TABLET | Freq: Two times a day (BID) | ORAL | Status: DC | PRN

## 2015-04-26 MED ORDER — OXYCODONE-ACETAMINOPHEN 5-325 MG PO TABS: 2.0000 | ORAL_TABLET | ORAL | Status: DC | PRN

## 2015-04-26 MED ORDER — NAPROXEN 500 MG PO TABS: 500.0000 mg | ORAL_TABLET | Freq: Two times a day (BID) | ORAL | Status: DC

## 2015-04-26 NOTE — Discharge Instructions (Signed)
Low Back Strain With Rehab A strain is an injury in which a tendon or muscle is torn. The muscles and tendons of the lower back are vulnerable to strains. However, these muscles and tendons are very strong and require a great force to be injured. Strains are classified into three categories. Grade 1 strains cause pain, but the tendon is not lengthened. Grade 2 strains include a lengthened ligament, due to the ligament being stretched or partially ruptured. With grade 2 strains there is still function, although the function may be decreased. Grade 3 strains involve a complete tear of the tendon or muscle, and function is usually impaired. SYMPTOMS   Pain in the lower back.  Pain that affects one side more than the other.  Pain that gets worse with movement and may be felt in the hip, buttocks, or back of the thigh.  Muscle spasms of the muscles in the back.  Swelling along the muscles of the back.  Loss of strength of the back muscles.  Crackling sound (crepitation) when the muscles are touched. CAUSES  Lower back strains occur when a force is placed on the muscles or tendons that is greater than they can handle. Common causes of injury include:  Prolonged overuse of the muscle-tendon units in the lower back, usually from incorrect posture.  A single violent injury or force applied to the back. RISK INCREASES WITH:  Sports that involve twisting forces on the spine or a lot of bending at the waist (football, rugby, weightlifting, bowling, golf, tennis, speed skating, racquetball, swimming, running, gymnastics, diving).  Poor strength and flexibility.  Failure to warm up properly before activity.  Family history of lower back pain or disk disorders.  Previous back injury or surgery (especially fusion).  Poor posture with lifting, especially heavy objects.  Prolonged sitting, especially with poor posture. PREVENTION   Learn and use proper posture when sitting or lifting (maintain  proper posture when sitting, lift using the knees and legs, not at the waist).  Warm up and stretch properly before activity.  Allow for adequate recovery between workouts.  Maintain physical fitness:  Strength, flexibility, and endurance.  Cardiovascular fitness. PROGNOSIS  If treated properly, lower back strains usually heal within 6 weeks. RELATED COMPLICATIONS   Recurring symptoms, resulting in a chronic problem.  Chronic inflammation, scarring, and partial muscle-tendon tear.  Delayed healing or resolution of symptoms.  Prolonged disability. TREATMENT  Treatment first involves the use of ice and medicine, to reduce pain and inflammation. The use of strengthening and stretching exercises may help reduce pain with activity. These exercises may be performed at home or with a therapist. Severe injuries may require referral to a therapist for further evaluation and treatment, such as ultrasound. Your caregiver may advise that you wear a back brace or corset, to help reduce pain and discomfort. Often, prolonged bed rest results in greater harm then benefit. Corticosteroid injections may be recommended. However, these should be reserved for the most serious cases. It is important to avoid using your back when lifting objects. At night, sleep on your back on a firm mattress with a pillow placed under your knees. If non-surgical treatment is unsuccessful, surgery may be needed.  MEDICATION   If pain medicine is needed, nonsteroidal anti-inflammatory medicines (aspirin and ibuprofen), or other minor pain relievers (acetaminophen), are often advised.  Do not take pain medicine for 7 days before surgery.  Prescription pain relievers may be given, if your caregiver thinks they are needed. Use only as  directed and only as much as you need.  Ointments applied to the skin may be helpful.  Corticosteroid injections may be given by your caregiver. These injections should be reserved for the most  serious cases, because they may only be given a certain number of times. HEAT AND COLD  Cold treatment (icing) should be applied for 10 to 15 minutes every 2 to 3 hours for inflammation and pain, and immediately after activity that aggravates your symptoms. Use ice packs or an ice massage.  Heat treatment may be used before performing stretching and strengthening activities prescribed by your caregiver, physical therapist, or athletic trainer. Use a heat pack or a warm water soak. SEEK MEDICAL CARE IF:   Symptoms get worse or do not improve in 2 to 4 weeks, despite treatment.  You develop numbness, weakness, or loss of bowel or bladder function.  New, unexplained symptoms develop. (Drugs used in treatment may produce side effects.) EXERCISES  RANGE OF MOTION (ROM) AND STRETCHING EXERCISES - Low Back Strain Most people with lower back pain will find that their symptoms get worse with excessive bending forward (flexion) or arching at the lower back (extension). The exercises which will help resolve your symptoms will focus on the opposite motion.  Your physician, physical therapist or athletic trainer will help you determine which exercises will be most helpful to resolve your lower back pain. Do not complete any exercises without first consulting with your caregiver. Discontinue any exercises which make your symptoms worse until you speak to your caregiver.  If you have pain, numbness or tingling which travels down into your buttocks, leg or foot, the goal of the therapy is for these symptoms to move closer to your back and eventually resolve. Sometimes, these leg symptoms will get better, but your lower back pain may worsen. This is typically an indication of progress in your rehabilitation. Be very alert to any changes in your symptoms and the activities in which you participated in the 24 hours prior to the change. Sharing this information with your caregiver will allow him/her to most efficiently  treat your condition.  These exercises may help you when beginning to rehabilitate your injury. Your symptoms may resolve with or without further involvement from your physician, physical therapist or athletic trainer. While completing these exercises, remember:  Restoring tissue flexibility helps normal motion to return to the joints. This allows healthier, less painful movement and activity.  An effective stretch should be held for at least 30 seconds.  A stretch should never be painful. You should only feel a gentle lengthening or release in the stretched tissue. FLEXION RANGE OF MOTION AND STRETCHING EXERCISES: STRETCH - Flexion, Single Knee to Chest   Lie on a firm bed or floor with both legs extended in front of you.  Keeping one leg in contact with the floor, bring your opposite knee to your chest. Hold your leg in place by either grabbing behind your thigh or at your knee.  Pull until you feel a gentle stretch in your lower back. Hold __________ seconds.  Slowly release your grasp and repeat the exercise with the opposite side. Repeat __________ times. Complete this exercise __________ times per day.  STRETCH - Flexion, Double Knee to Chest   Lie on a firm bed or floor with both legs extended in front of you.  Keeping one leg in contact with the floor, bring your opposite knee to your chest.  Tense your stomach muscles to support your back and then   lift your other knee to your chest. Hold your legs in place by either grabbing behind your thighs or at your knees.  Pull both knees toward your chest until you feel a gentle stretch in your lower back. Hold __________ seconds.  Tense your stomach muscles and slowly return one leg at a time to the floor. Repeat __________ times. Complete this exercise __________ times per day.  STRETCH - Low Trunk Rotation  Lie on a firm bed or floor. Keeping your legs in front of you, bend your knees so they are both pointed toward the ceiling  and your feet are flat on the floor.  Extend your arms out to the side. This will stabilize your upper body by keeping your shoulders in contact with the floor.  Gently and slowly drop both knees together to one side until you feel a gentle stretch in your lower back. Hold for __________ seconds.  Tense your stomach muscles to support your lower back as you bring your knees back to the starting position. Repeat the exercise to the other side. Repeat __________ times. Complete this exercise __________ times per day  EXTENSION RANGE OF MOTION AND FLEXIBILITY EXERCISES: STRETCH - Extension, Prone on Elbows   Lie on your stomach on the floor, a bed will be too soft. Place your palms about shoulder width apart and at the height of your head.  Place your elbows under your shoulders. If this is too painful, stack pillows under your chest.  Allow your body to relax so that your hips drop lower and make contact more completely with the floor.  Hold this position for __________ seconds.  Slowly return to lying flat on the floor. Repeat __________ times. Complete this exercise __________ times per day.  RANGE OF MOTION - Extension, Prone Press Ups  Lie on your stomach on the floor, a bed will be too soft. Place your palms about shoulder width apart and at the height of your head.  Keeping your back as relaxed as possible, slowly straighten your elbows while keeping your hips on the floor. You may adjust the placement of your hands to maximize your comfort. As you gain motion, your hands will come more underneath your shoulders.  Hold this position __________ seconds.  Slowly return to lying flat on the floor. Repeat __________ times. Complete this exercise __________ times per day.  RANGE OF MOTION- Quadruped, Neutral Spine   Assume a hands and knees position on a firm surface. Keep your hands under your shoulders and your knees under your hips. You may place padding under your knees for  comfort.  Drop your head and point your tail bone toward the ground below you. This will round out your lower back like an angry cat. Hold this position for __________ seconds.  Slowly lift your head and release your tail bone so that your back sags into a large arch, like an old horse.  Hold this position for __________ seconds.  Repeat this until you feel limber in your lower back.  Now, find your "sweet spot." This will be the most comfortable position somewhere between the two previous positions. This is your neutral spine. Once you have found this position, tense your stomach muscles to support your lower back.  Hold this position for __________ seconds. Repeat __________ times. Complete this exercise __________ times per day.  STRENGTHENING EXERCISES - Low Back Strain These exercises may help you when beginning to rehabilitate your injury. These exercises should be done near your "sweet   spot." This is the neutral, low-back arch, somewhere between fully rounded and fully arched, that is your least painful position. When performed in this safe range of motion, these exercises can be used for people who have either a flexion or extension based injury. These exercises may resolve your symptoms with or without further involvement from your physician, physical therapist or athletic trainer. While completing these exercises, remember:  °· Muscles can gain both the endurance and the strength needed for everyday activities through controlled exercises. °· Complete these exercises as instructed by your physician, physical therapist or athletic trainer. Increase the resistance and repetitions only as guided. °· You may experience muscle soreness or fatigue, but the pain or discomfort you are trying to eliminate should never worsen during these exercises. If this pain does worsen, stop and make certain you are following the directions exactly. If the pain is still present after adjustments, discontinue the  exercise until you can discuss the trouble with your caregiver. °STRENGTHENING - Deep Abdominals, Pelvic Tilt °· Lie on a firm bed or floor. Keeping your legs in front of you, bend your knees so they are both pointed toward the ceiling and your feet are flat on the floor. °· Tense your lower abdominal muscles to press your lower back into the floor. This motion will rotate your pelvis so that your tail bone is scooping upwards rather than pointing at your feet or into the floor. °· With a gentle tension and even breathing, hold this position for __________ seconds. °Repeat __________ times. Complete this exercise __________ times per day.  °STRENGTHENING - Abdominals, Crunches  °· Lie on a firm bed or floor. Keeping your legs in front of you, bend your knees so they are both pointed toward the ceiling and your feet are flat on the floor. Cross your arms over your chest. °· Slightly tip your chin down without bending your neck. °· Tense your abdominals and slowly lift your trunk high enough to just clear your shoulder blades. Lifting higher can put excessive stress on the lower back and does not further strengthen your abdominal muscles. °· Control your return to the starting position. °Repeat __________ times. Complete this exercise __________ times per day.  °STRENGTHENING - Quadruped, Opposite UE/LE Lift  °· Assume a hands and knees position on a firm surface. Keep your hands under your shoulders and your knees under your hips. You may place padding under your knees for comfort. °· Find your neutral spine and gently tense your abdominal muscles so that you can maintain this position. Your shoulders and hips should form a rectangle that is parallel with the floor and is not twisted. °· Keeping your trunk steady, lift your right hand no higher than your shoulder and then your left leg no higher than your hip. Make sure you are not holding your breath. Hold this position __________ seconds. °· Continuing to keep your  abdominal muscles tense and your back steady, slowly return to your starting position. Repeat with the opposite arm and leg. °Repeat __________ times. Complete this exercise __________ times per day.  °STRENGTHENING - Lower Abdominals, Double Knee Lift °· Lie on a firm bed or floor. Keeping your legs in front of you, bend your knees so they are both pointed toward the ceiling and your feet are flat on the floor. °· Tense your abdominal muscles to brace your lower back and slowly lift both of your knees until they come over your hips. Be certain not to hold your breath. °·   Hold __________ seconds. Using your abdominal muscles, return to the starting position in a slow and controlled manner. Repeat __________ times. Complete this exercise __________ times per day.  POSTURE AND BODY MECHANICS CONSIDERATIONS - Low Back Strain Keeping correct posture when sitting, standing or completing your activities will reduce the stress put on different body tissues, allowing injured tissues a chance to heal and limiting painful experiences. The following are general guidelines for improved posture. Your physician or physical therapist will provide you with any instructions specific to your needs. While reading these guidelines, remember:  The exercises prescribed by your provider will help you have the flexibility and strength to maintain correct postures.  The correct posture provides the best environment for your joints to work. All of your joints have less wear and tear when properly supported by a spine with good posture. This means you will experience a healthier, less painful body.  Correct posture must be practiced with all of your activities, especially prolonged sitting and standing. Correct posture is as important when doing repetitive low-stress activities (typing) as it is when doing a single heavy-load activity (lifting). RESTING POSITIONS Consider which positions are most painful for you when choosing a  resting position. If you have pain with flexion-based activities (sitting, bending, stooping, squatting), choose a position that allows you to rest in a less flexed posture. You would want to avoid curling into a fetal position on your side. If your pain worsens with extension-based activities (prolonged standing, working overhead), avoid resting in an extended position such as sleeping on your stomach. Most people will find more comfort when they rest with their spine in a more neutral position, neither too rounded nor too arched. Lying on a non-sagging bed on your side with a pillow between your knees, or on your back with a pillow under your knees will often provide some relief. Keep in mind, being in any one position for a prolonged period of time, no matter how correct your posture, can still lead to stiffness. PROPER SITTING POSTURE In order to minimize stress and discomfort on your spine, you must sit with correct posture. Sitting with good posture should be effortless for a healthy body. Returning to good posture is a gradual process. Many people can work toward this most comfortably by using various supports until they have the flexibility and strength to maintain this posture on their own. When sitting with proper posture, your ears will fall over your shoulders and your shoulders will fall over your hips. You should use the back of the chair to support your upper back. Your lower back will be in a neutral position, just slightly arched. You may place a small pillow or folded towel at the base of your lower back for support.  When working at a desk, create an environment that supports good, upright posture. Without extra support, muscles tire, which leads to excessive strain on joints and other tissues. Keep these recommendations in mind: CHAIR:  A chair should be able to slide under your desk when your back makes contact with the back of the chair. This allows you to work closely.  The chair's  height should allow your eyes to be level with the upper part of your monitor and your hands to be slightly lower than your elbows. BODY POSITION  Your feet should make contact with the floor. If this is not possible, use a foot rest.  Keep your ears over your shoulders. This will reduce stress on your neck and  lower back. INCORRECT SITTING POSTURES  If you are feeling tired and unable to assume a healthy sitting posture, do not slouch or slump. This puts excessive strain on your back tissues, causing more damage and pain. Healthier options include:  Using more support, like a lumbar pillow.  Switching tasks to something that requires you to be upright or walking.  Talking a brief walk.  Lying down to rest in a neutral-spine position. PROLONGED STANDING WHILE SLIGHTLY LEANING FORWARD  When completing a task that requires you to lean forward while standing in one place for a long time, place either foot up on a stationary 2-4 inch high object to help maintain the best posture. When both feet are on the ground, the lower back tends to lose its slight inward curve. If this curve flattens (or becomes too large), then the back and your other joints will experience too much stress, tire more quickly, and can cause pain. CORRECT STANDING POSTURES Proper standing posture should be assumed with all daily activities, even if they only take a few moments, like when brushing your teeth. As in sitting, your ears should fall over your shoulders and your shoulders should fall over your hips. You should keep a slight tension in your abdominal muscles to brace your spine. Your tailbone should point down to the ground, not behind your body, resulting in an over-extended swayback posture.  INCORRECT STANDING POSTURES  Common incorrect standing postures include a forward head, locked knees and/or an excessive swayback. WALKING Walk with an upright posture. Your ears, shoulders and hips should all  line-up. PROLONGED ACTIVITY IN A FLEXED POSITION When completing a task that requires you to bend forward at your waist or lean over a low surface, try to find a way to stabilize 3 out of 4 of your limbs. You can place a hand or elbow on your thigh or rest a knee on the surface you are reaching across. This will provide you more stability so that your muscles do not fatigue as quickly. By keeping your knees relaxed, or slightly bent, you will also reduce stress across your lower back. CORRECT LIFTING TECHNIQUES DO :   Assume a wide stance. This will provide you more stability and the opportunity to get as close as possible to the object which you are lifting.  Tense your abdominals to brace your spine. Bend at the knees and hips. Keeping your back locked in a neutral-spine position, lift using your leg muscles. Lift with your legs, keeping your back straight.  Test the weight of unknown objects before attempting to lift them.  Try to keep your elbows locked down at your sides in order get the best strength from your shoulders when carrying an object.  Always ask for help when lifting heavy or awkward objects. INCORRECT LIFTING TECHNIQUES DO NOT:   Lock your knees when lifting, even if it is a small object.  Bend and twist. Pivot at your feet or move your feet when needing to change directions.  Assume that you can safely pick up even a paper clip without proper posture.   This information is not intended to replace advice given to you by your health care provider. Make sure you discuss any questions you have with your health care provider.   Follow up with your PCP if your symptoms do not improve. Apply ice to affected area. Take muscle relaxers and Naprosyn as needed. Return to the ED if you experience severe worsening of your symptoms, bowel or  bladder incontinence, numbness/tingling in your lower extremities.

## 2016-10-17 ENCOUNTER — Encounter: Payer: Self-pay | Admitting: Family Medicine

## 2017-02-06 ENCOUNTER — Encounter: Payer: Self-pay | Admitting: Family Medicine

## 2017-06-25 ENCOUNTER — Ambulatory Visit (INDEPENDENT_AMBULATORY_CARE_PROVIDER_SITE_OTHER): Payer: Self-pay | Admitting: Orthopaedic Surgery

## 2017-11-15 ENCOUNTER — Ambulatory Visit (INDEPENDENT_AMBULATORY_CARE_PROVIDER_SITE_OTHER): Payer: 59

## 2017-11-15 ENCOUNTER — Encounter: Payer: Self-pay | Admitting: Podiatry

## 2017-11-15 ENCOUNTER — Ambulatory Visit: Payer: 59 | Admitting: Podiatry

## 2017-11-15 DIAGNOSIS — M7741 Metatarsalgia, right foot: Secondary | ICD-10-CM

## 2017-11-15 DIAGNOSIS — M779 Enthesopathy, unspecified: Secondary | ICD-10-CM

## 2017-11-15 DIAGNOSIS — M7751 Other enthesopathy of right foot: Secondary | ICD-10-CM | POA: Diagnosis not present

## 2017-11-15 DIAGNOSIS — M205X1 Other deformities of toe(s) (acquired), right foot: Secondary | ICD-10-CM | POA: Diagnosis not present

## 2017-11-15 MED ORDER — MELOXICAM 15 MG PO TABS
15.0000 mg | ORAL_TABLET | Freq: Every day | ORAL | 0 refills | Status: DC
Start: 2017-11-15 — End: 2021-12-18

## 2017-11-15 MED ORDER — TRIAMCINOLONE ACETONIDE 10 MG/ML IJ SUSP
10.0000 mg | Freq: Once | INTRAMUSCULAR | Status: AC
Start: 2017-11-15 — End: 2017-11-15
  Administered 2017-11-15: 10 mg

## 2017-11-15 NOTE — Progress Notes (Signed)
Subjective:    Patient ID: Seth Robinson, male    DOB: Feb 22, 1960, 57 y.o.   MRN: [id number]  HPI 57 year old male presents the office today for concerns of right foot pain.  He states that he has pain he points on the big toe joint as well as the office as to the foot point of the fifth metatarsal head at times.  The majority pain is the big toe joint.  He states that he has had pain over the last couple years and this foot has been progressively getting worse.  He also has a history of plantar fasciitis in the right foot and he was seeing Dr. Elijah Robinson for he had 2 steroid injections as well as inserts of the inserts have not been very helpful.  Denies any recent injury or trauma.  Minimal swelling to the big toe joint but no recent injury or trauma that he can recall.  He has no other concerns.   Review of Systems  All other systems reviewed and are negative.  Past Medical History:  Diagnosis Date  . Frequent headaches   . GERD (gastroesophageal reflux disease)   . Migraines   . Seasonal allergies     Past Surgical History:  Procedure Laterality Date  . ADENOIDECTOMY  1970  . CERVICAL DISCECTOMY  Jan 2009   c5-c6,c6-c7     Current Outpatient Medications:  .  aspirin-acetaminophen-caffeine (EXCEDRIN MIGRAINE) 250-250-65 MG tablet, Take 2 tablets by mouth every 6 (six) hours as needed for headache., Disp: , Rfl:  .  cyclobenzaprine (AMRIX) 15 MG 24 hr capsule, Take 15 mg by mouth daily as needed for muscle spasms., Disp: , Rfl:  .  cyclobenzaprine (FLEXERIL) 10 MG tablet, Take 1 tablet (10 mg total) by mouth 2 (two) times daily as needed for muscle spasms., Disp: 20 tablet, Rfl: 0 .  ibuprofen (ADVIL,MOTRIN) 200 MG tablet, Take 600 mg by mouth every 6 (six) hours as needed for moderate pain., Disp: , Rfl:  .  meloxicam (MOBIC) 15 MG tablet, Take 1 tablet (15 mg total) by mouth daily., Disp: 30 tablet, Rfl: 0 .  Multiple Vitamins-Minerals (GNP MEGA MULTI FOR MEN) TABS, Take 1 tablet  by mouth daily. , Disp: , Rfl:  .  naproxen (NAPROSYN) 500 MG tablet, Take 1 tablet (500 mg total) by mouth 2 (two) times daily., Disp: 30 tablet, Rfl: 0 .  naproxen sodium (ANAPROX) 220 MG tablet, Take 440 mg by mouth 2 (two) times daily as needed (back pain)., Disp: , Rfl:  .  Omega-3 Fatty Acids (FISH OIL) 1200 MG CAPS, Take 1 capsule by mouth daily. , Disp: , Rfl:  .  oxyCODONE-acetaminophen (PERCOCET/ROXICET) 5-325 MG tablet, Take 2 tablets by mouth every 4 (four) hours as needed for severe pain., Disp: 4 tablet, Rfl: 0  No Known Allergies       Objective:   Physical Exam  General: AAO x3, NAD  Dermatological: Skin is warm, dry and supple bilateral. Nails x 10 are well manicured; remaining integument appears unremarkable at this time. There are no open sores, no preulcerative lesions, no rash or signs of infection present.  Vascular: Dorsalis Pedis artery and Posterior Tibial artery pedal pulses are 2/4 bilateral with immedate capillary fill time. Pedal hair growth present. No varicosities and no lower extremity edema present bilateral. There is no pain with calf compression, swelling, warmth, erythema.   Neruologic: Grossly intact via light touch bilateral. Protective threshold with Semmes Wienstein monofilament intact to all pedal sites  bilateral.   Musculoskeletal: There is decrease in first MPJ range of motion particularly dorsiflexion.  Minimal swelling to the first MPJ.  Tenderness palpation of the first MPJ as well.  There is a decrease in medial arch upon weightbearing.  Subjectively he gets some discomfort in the lateral aspect foot on the fifth metatarsal head but not able to palpate any area of tenderness identified today.  No swelling to this area mild prominence of metatarsal heads plantarly.  Muscular strength 5/5 in all groups tested bilateral.  Gait: Unassisted, Nonantalgic.     Assessment & Plan:  57 year old male capsulitis right foot with hallux limitus,  flatfoot -Treatment options discussed including all alternatives, risks, and complications -Etiology of symptoms were discussed -X-rays were obtained and reviewed with the patient.  Arthritic changes present the first MPJ.  No evidence of acute fracture identified today. -Steroid injection for the first MPJ performed.  See procedure note below. -Prescribed mobic. Discussed side effects of the medication and directed to stop if any are to occur and call the office.  -Discussed the orthotics.  Also he needs to have either have a Mortons extension or cut out.  I would have Seth Robinson evaluate him and he will get measured for new inserts.  Also more arch support will be beneficial as the inserts he has he does not feel it helping with the arch.  Procedure: Injection small joint, toe Discussed alternatives, risks, complications and verbal consent was obtained.  Location: Right first MPJ Skin Prep: Betadine. Injectate: 0.5cc 0.5% marcaine plain, 0.5 cc 2% lidocaine plain and, 1 cc kenalog 10. Disposition: Patient tolerated procedure well. Injection site dressed with a band-aid.  Post-injection care was discussed and return precautions discussed.   Seth Robinson DPM\

## 2017-11-24 ENCOUNTER — Ambulatory Visit (INDEPENDENT_AMBULATORY_CARE_PROVIDER_SITE_OTHER): Payer: 59 | Admitting: Orthotics

## 2017-11-24 DIAGNOSIS — M79672 Pain in left foot: Secondary | ICD-10-CM | POA: Diagnosis not present

## 2017-11-24 DIAGNOSIS — M205X1 Other deformities of toe(s) (acquired), right foot: Secondary | ICD-10-CM

## 2017-11-24 DIAGNOSIS — M7741 Metatarsalgia, right foot: Secondary | ICD-10-CM

## 2017-11-24 NOTE — Progress Notes (Signed)
Patient is here today to be evaluated and cast for CMFO.  Patient has hx of functional hallux limitus (FHL), and needs a supportive orthoses that will plantarflex first ray in order to lower hinge pin of first MPJ and enhance windless effect.  Plan of deep heel cup, hug arch, and reverse mortons extension.  [facility]y to fab

## 2017-12-17 ENCOUNTER — Ambulatory Visit (INDEPENDENT_AMBULATORY_CARE_PROVIDER_SITE_OTHER): Payer: 59 | Admitting: Orthotics

## 2017-12-17 DIAGNOSIS — M7741 Metatarsalgia, right foot: Secondary | ICD-10-CM

## 2017-12-17 DIAGNOSIS — M779 Enthesopathy, unspecified: Secondary | ICD-10-CM

## 2017-12-17 NOTE — Progress Notes (Signed)
Patient came in today to pick up custom made foot orthotics.  The goals were accomplished and the patient reported no dissatisfaction with said orthotics.  Patient was advised of breakin period and how to report any issues. 

## 2019-03-29 ENCOUNTER — Encounter (HOSPITAL_COMMUNITY): Payer: Self-pay | Admitting: *Deleted

## 2019-03-29 ENCOUNTER — Other Ambulatory Visit: Payer: Self-pay

## 2019-03-29 ENCOUNTER — Emergency Department (HOSPITAL_COMMUNITY)
Admission: EM | Admit: 2019-03-29 | Discharge: 2019-03-29 | Disposition: A | Discharge status: Home / Self Care | Payer: 59 | Attending: Emergency Medicine | Admitting: Emergency Medicine

## 2019-03-29 DIAGNOSIS — S61217A Laceration without foreign body of left little finger without damage to nail, initial encounter: Secondary | ICD-10-CM | POA: Diagnosis present

## 2019-03-29 DIAGNOSIS — Y929 Unspecified place or not applicable: Secondary | ICD-10-CM | POA: Insufficient documentation

## 2019-03-29 DIAGNOSIS — W260XXA Contact with knife, initial encounter: Secondary | ICD-10-CM | POA: Insufficient documentation

## 2019-03-29 DIAGNOSIS — Z79899 Other long term (current) drug therapy: Secondary | ICD-10-CM | POA: Insufficient documentation

## 2019-03-29 DIAGNOSIS — Y93G1 Activity, food preparation and clean up: Secondary | ICD-10-CM | POA: Insufficient documentation

## 2019-03-29 DIAGNOSIS — Y999 Unspecified external cause status: Secondary | ICD-10-CM | POA: Diagnosis not present

## 2019-03-29 MED ORDER — DOUBLE ANTIBIOTIC 500-10000 UNIT/GM EX OINT
TOPICAL_OINTMENT | Freq: Once | CUTANEOUS | Status: AC
  Administered 2019-03-29: 1 via TOPICAL

## 2019-03-29 MED ORDER — LIDOCAINE HCL (PF) 1 % IJ SOLN
INTRAMUSCULAR | Status: AC
  Administered 2019-03-29: 5 mL
  Filled 2019-03-29: qty 30

## 2019-03-29 NOTE — Discharge Instructions (Addendum)
Apply bacitracin twice daily Keep a sterile dressing on th wound for 3 days See your doctor for stitch removal in next 7 days ER for increased redness, swelling or pus / fever  Tylenol or ibuprofen as needed for pain

## 2019-03-29 NOTE — ED Triage Notes (Signed)
Pt cut tip of left pinky while cutting sweet potatoes.  Last tetanus may 2014

## 2019-03-29 NOTE — ED Provider Notes (Signed)
[facility] EMERGENCY DEPARTMENT Provider Note   CSN: [id number] Arrival date & time: 03/29/19  1935     History Chief Complaint  Patient presents with  . Laceration    Seth Robinson is a 59 y.o. male.  HPI   This patient is a 59 year old male presenting with a laceration to his left small finger.  This occurred just prior to arrival while he was cutting sweet potatoes and accidentally sliced through the tip of his small finger from the pad towards the nail.  This occurred acutely, pain is persistent, mild to moderate, worse with touching the finger, associated with some bleeding.  The patient is not on anticoagulants.  Past Medical History:  Diagnosis Date  . Frequent headaches   . GERD (gastroesophageal reflux disease)   . Migraines   . Seasonal allergies     Patient Active Problem List   Diagnosis Date Noted  . 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 ALLERGIC RHINITIS 12/14/2007  . HEADACHE, CHRONIC 12/14/2007    Past Surgical History:  Procedure Laterality Date  . ADENOIDECTOMY  1970  . CERVICAL DISCECTOMY  Jan 2009   c5-c6,c6-c7       Family History  Problem Relation Age of Onset  . Lung cancer Maternal Grandfather   . Heart disease Maternal Grandfather   . Prostate cancer Father   . Testicular cancer Father   . Kidney disease Paternal Uncle   . Colon cancer Neg Hx     Social History   Tobacco Use  . Smoking status: Never Smoker  . Smokeless tobacco: Never Used  Substance Use Topics  . Alcohol use: Yes    Alcohol/week: 5.0 standard drinks    Types: 5 Cans of beer per week  . Drug use: No    Home Medications Prior to Admission medications   Medication Sig Start Date End Date Taking? Authorizing Provider  aspirin-acetaminophen-caffeine (EXCEDRIN MIGRAINE) [phone] MG tablet Take 2 tablets by mouth every 6 (six) hours as needed for headache.    [provider]  cyclobenzaprine (AMRIX) 15 MG 24 hr capsule Take 15 mg by mouth daily as needed for  muscle spasms.    [provider]  cyclobenzaprine (FLEXERIL) 10 MG tablet Take 1 tablet (10 mg total) by mouth 2 (two) times daily as needed for muscle spasms. 04/26/15   Dowless, Lelon Mast Tripp, PA-C  ibuprofen (ADVIL,MOTRIN) 200 MG tablet Take 600 mg by mouth every 6 (six) hours as needed for moderate pain.    [provider]  meloxicam (MOBIC) 15 MG tablet Take 1 tablet (15 mg total) by mouth daily. 11/15/17   Vivi Barrack, DPM  Multiple Vitamins-Minerals (GNP MEGA MULTI FOR MEN) TABS Take 1 tablet by mouth daily.     [provider]  naproxen (NAPROSYN) 500 MG tablet Take 1 tablet (500 mg total) by mouth 2 (two) times daily. 04/26/15   Dowless, Lelon Mast Tripp, PA-C  naproxen sodium (ANAPROX) 220 MG tablet Take 440 mg by mouth 2 (two) times daily as needed (back pain).    [provider]  Omega-3 Fatty Acids (FISH OIL) 1200 MG CAPS Take 1 capsule by mouth daily.     [provider]  oxyCODONE-acetaminophen (PERCOCET/ROXICET) 5-325 MG tablet Take 2 tablets by mouth every 4 (four) hours as needed for severe pain. 04/26/15   Dowless, Lester Kinsman, PA-C    Allergies    Patient has no known allergies.  Review of Systems   Review of Systems  Skin: Positive for wound.  Neurological: Negative for weakness and numbness.  Hematological: Does not bruise/bleed easily.    Physical Exam Updated Vital Signs BP (!) 150/94 (BP Location: Right Arm)   Pulse 69   Temp 98 F (36.7 C) (Oral)   Resp 16   Ht 1.778 m (5\' 10" )   Wt 83.9 kg   SpO2 100%   BMI 26.54 kg/m   Physical Exam Constitutional:      General: He is not in acute distress.    Appearance: He is well-developed. He is not diaphoretic.  HENT:     Head: Normocephalic.  Eyes:     General: No scleral icterus.    Conjunctiva/sclera: Conjunctivae normal.  Cardiovascular:     Rate and Rhythm: Normal rate and regular rhythm.  Pulmonary:     Effort: Pulmonary effort is normal.      Breath sounds: Normal breath sounds.  Musculoskeletal:        General: Tenderness ( ttp over the laceration site) present. Normal range of motion.     Comments: Tip with a small finger with a soft tissue laceration, linear involving the majority of the pad of the finger distally, does not involve the nail.  Skin:    General: Skin is warm and dry.     Comments: Laceration located on tip of the fifth left-sided finger The Laceration is linear shaped The depth is subcutaneous The length is 2 cm  Neurological:     Mental Status: He is alert.     Coordination: Coordination normal.     Comments: Sensation and motor intact except for the tip of the finger which is involved in the laceration which is insensate     ED Results / Procedures / Treatments   Labs (all labs ordered are listed, but only abnormal results are displayed) Labs Reviewed - No data to display  EKG None  Radiology No results found.  Procedures . Laceration Repair  Date/Time: 03/29/2019 8:13 PM Performed by: 05/29/2019, MD Authorized by: Eber Hong, MD   Consent:    Consent obtained:  Verbal   Consent given by:  Patient   Risks discussed:  Infection, pain, need for additional repair, poor cosmetic result and poor wound healing   Alternatives discussed:  No treatment and delayed treatment Anesthesia (see MAR for exact dosages):    Anesthesia method:  Local infiltration   Local anesthetic:  Lidocaine 1% w/o epi Laceration details:    Location:  Finger   Finger location:  L small finger   Length (cm):  2   Depth (mm):  2 Repair type:    Repair type:  Simple Pre-procedure details:    Preparation:  Patient was prepped and draped in usual sterile fashion and imaging obtained to evaluate for foreign bodies Exploration:    Hemostasis achieved with:  Tourniquet and direct pressure   Wound exploration: wound explored through full range of motion and entire depth of wound probed and visualized     Wound  extent: no fascia violation noted, no foreign bodies/material noted, no muscle damage noted, no nerve damage noted, no tendon damage noted, no underlying fracture noted and no vascular damage noted   Treatment:    Area cleansed with:  Betadine   Amount of cleaning:  Extensive   Irrigation solution:  Sterile saline   Irrigation volume:  1000   Irrigation method:  Syringe Skin repair:    Repair method:  Sutures   Suture size:  5-0   Suture material:  Prolene  Suture technique:  Simple interrupted   Number of sutures:  3 Approximation:    Approximation:  Close Post-procedure details:    Dressing:  Antibiotic ointment and sterile dressing   Patient tolerance of procedure:  Tolerated well, no immediate complications Comments:         (including critical care time)  Medications Ordered in ED Medications  lidocaine (PF) (XYLOCAINE) 1 % injection (5 mLs  Given 03/29/19 2012)  polymixin-bacitracin (POLYSPORIN) ointment (1 application Topical Given 03/29/19 2012)    ED Course  I have reviewed the triage vital signs and the nursing notes.  Pertinent labs & imaging results that were available during my care of the patient were reviewed by me and considered in my medical decision making (see chart for details).    MDM Rules/Calculators/A&P                      This patient has a simple laceration repaired, no indication for imaging, this was a clean laceration without visible 5 foreign body, repaired with 3 sutures, patient given instructions on close follow-up, may or may not preserve the tip of the finger based on the location and the superficial nature of the laceration.  Patient agreeable  Final Clinical Impression(s) / ED Diagnoses Final diagnoses:  Laceration of left little finger without foreign body without damage to nail, initial encounter    Rx / DC Orders ED Discharge Orders    None       Noemi Chapel, MD 03/29/19 2014

## 2021-12-18 ENCOUNTER — Encounter: Payer: Self-pay | Admitting: General Surgery

## 2021-12-18 ENCOUNTER — Ambulatory Visit: Payer: 59 | Admitting: General Surgery

## 2021-12-18 VITALS — BP 123/82 | HR 74 | Temp 98.7°F | Resp 12 | Ht 70.0 in | Wt 200.0 lb

## 2021-12-18 DIAGNOSIS — K4090 Unilateral inguinal hernia, without obstruction or gangrene, not specified as recurrent: Secondary | ICD-10-CM

## 2021-12-18 NOTE — Patient Instructions (Signed)
Robotic Assisted Laparoscopic Inguinal Hernia Repair, Adult Laparoscopic inguinal hernia repair is a surgical procedure to repair a small, weak spot in the groin muscles that allows fat or intestines from inside the abdomen to bulge out (inguinal hernia). This procedure may be planned, or it may be an emergency procedure. During the procedure, tissue that has bulged out is moved back into place, and the opening in the groin muscles is repaired. This is done through three small incisions in the abdomen. A thin tube with a light and camera on the end (laparoscope) is used to help perform the procedure. Tell a health care provider about: Any allergies you have. All medicines you are taking, including vitamins, herbs, eye drops, creams, and over-the-counter medicines. Any problems you or family members have had with anesthetic medicines. Any blood disorders you have. Any surgeries you have had. Any medical conditions you have. Whether you are pregnant or may be pregnant. What are the risks? Generally, this is a safe procedure. However, problems may occur, including: Infection. Bleeding. Allergic reactions to medicines. Damage to nearby structures or organs. Testicle damage or long-term pain and swelling of the scrotum, in males. Inability to completely empty the bladder (urinary retention). Blood clots. A collection of fluid that builds up under the skin (seroma). The hernia coming back (recurrence). What happens before the procedure? Medicines Ask your health care provider about: Changing or stopping your regular medicines. This is especially important if you are taking diabetes medicines or blood thinners. Taking medicines such as aspirin and ibuprofen. These medicines can thin your blood. Do not take these medicines unless your health care provider tells you to take them. Taking over-the-counter medicines, vitamins, herbs, and supplements. General instructions Do not use any products that  contain nicotine or tobacco for at least 4 weeks before the procedure, if possible. These products include cigarettes, chewing tobacco, and vaping devices, such as e-cigarettes. If you need help quitting, ask your health care provider. Ask your health care provider: How your surgery site will be marked. What steps will be taken to help prevent infection. These steps may include: Removing hair at the surgery site. Washing skin with a germ-killing soap. Taking antibiotic medicine. Plan to have a responsible adult take you home from the hospital or clinic. Plan to have a responsible adult care for you for the time you are told after you leave the hospital or clinic. This is important. What happens during the procedure? An IV will be inserted into one of your veins. You will be given one or more of the following: A medicine to help you relax (sedative). A medicine to make you fall asleep (general anesthetic). Three small incisions will be made in your abdomen. Your abdomen will be inflated with carbon dioxide gas to make the surgical area easier to see. A laparoscope and surgical instruments will be inserted through the incisions. The laparoscope will send images of the inside of your abdomen to a monitor in the room. Tissue that is bulging through the hernia may be removed or moved back into place. The hernia opening will be closed with a sheet of surgical mesh. The surgical instruments and laparoscope will be removed. Your incisions will be closed with stitches (sutures) and adhesive strips. A bandage (dressing) will be placed over your incisions. The procedure may vary among health care providers and hospitals. What happens after the procedure? Your blood pressure, heart rate, breathing rate, and blood oxygen level will be monitored until you leave the hospital or   clinic. You will be given pain medicine as needed. You may continue to receive medicines and fluids through an IV. The IV will be  removed after you can drink fluids. You will be encouraged to get up and move around and to take deep breaths frequently. If you were given a sedative during the procedure, it can affect you for several hours. Do not drive or operate machinery until your health care provider says that it is safe. Summary Laparoscopic inguinal hernia repair is a surgical procedure to repair a small, weak spot in the groin muscles that allows fat or intestines from inside the abdomen to bulge out (inguinal hernia). This procedure is done through three small incisions in the abdomen. A thin tube with a light and camera on the end (laparoscope) is used to help perform the procedure. After the procedure, you will be encouraged to get up and move around and to take deep breaths frequently. This information is not intended to replace advice given to you by your health care provider. Make sure you discuss any questions you have with your health care provider. Document Revised: 09/14/2019 Document Reviewed: 09/14/2019 Elsevier Patient Education  2023 Elsevier Inc.  

## 2021-12-18 NOTE — Progress Notes (Signed)
[facility] History and Physical  Reason for Referral: Right inguinal hernia  Referring Physician: Self   Chief Complaint   New Patient (Initial Visit)     Seth Robinson is a 61 y.o. male.  HPI: Seth Robinson is a 61 yo who has noticed a right inguinal hernia for several years but says it is getting larger and causing him some discomfort mostly with sitting. It has never been stuck out or hard. He is otherwise healthy and active.   Past Medical History:  Diagnosis Date   Frequent headaches    GERD (gastroesophageal reflux disease)    Migraines    Seasonal allergies     Past Surgical History:  Procedure Laterality Date   ADENOIDECTOMY  1970   CERVICAL DISCECTOMY  Jan 2009   c5-c6,c6-c7    Family History  Problem Relation Age of Onset   Lung cancer Maternal Grandfather    Heart disease Maternal Grandfather    Prostate cancer Father    Testicular cancer Father    Kidney disease Paternal Uncle    Colon cancer Neg Hx     Social History   Tobacco Use   Smoking status: Never   Smokeless tobacco: Never  Substance Use Topics   Alcohol use: Yes    Alcohol/week: 5.0 standard drinks of alcohol    Types: 5 Cans of beer per week   Drug use: No    Medications: I have reviewed the patient's current medications. Allergies as of 12/18/2021   No Known Allergies      Medication List        Accurate as of December 18, 2021  2:16 PM. If you have any questions, ask your nurse or doctor.          STOP taking these medications    aspirin-acetaminophen-caffeine 250-250-65 MG tablet Commonly known as: EXCEDRIN MIGRAINE Stopped by: Srihan Brutus C Andy Moye, MD   cyclobenzaprine 10 MG tablet Commonly known as: FLEXERIL Stopped by: Kolt Mcwhirter C Diontay Rosencrans, MD   cyclobenzaprine 15 MG 24 hr capsule Commonly known as: AMRIX Stopped by: Elston Aldape C Latreece Mochizuki, MD   Fish Oil 1200 MG Caps Stopped by: Candia Kingsbury C Samanta Gal, MD   ibuprofen 200 MG tablet Commonly known as:  ADVIL Stopped by: Dariusz Brase C Cyril Woodmansee, MD   meloxicam 15 MG tablet Commonly known as: MOBIC Stopped by: Rogena Deupree C Jayni Prescher, MD   naproxen 500 MG tablet Commonly known as: NAPROSYN Stopped by: Zendaya Groseclose C Deklyn Gibbon, MD   naproxen sodium 220 MG tablet Commonly known as: ALEVE Stopped by: Sukhman Martine C Jaqualin Serpa, MD   oxyCODONE-acetaminophen 5-325 MG tablet Commonly known as: PERCOCET/ROXICET Stopped by: Rainelle Sulewski C Buffie Herne, MD       TAKE these medications    GNP Mega Multi for Men Tabs Take 1 tablet by mouth daily.         ROS:  A comprehensive review of systems was negative except for: Gastrointestinal: positive for abdominal pain, nausea, and reflux symptoms Genitourinary: positive for frequency Musculoskeletal: positive for back pain and neck pain  Blood pressure 123/82, pulse 74, temperature 98.7 F (37.1 C), temperature source Oral, resp. rate 12, height 5' 10" (1.778 m), weight 200 lb (90.7 kg), SpO2 95 %. Physical Exam Vitals reviewed.  Constitutional:      Appearance: Normal appearance.  HENT:     Head: Normocephalic.     Nose: Nose normal.  Eyes:     Pupils: Pupils are equal, round, and reactive to light.  Cardiovascular:     Rate   and Rhythm: Normal rate and regular rhythm.  Pulmonary:     Effort: Pulmonary effort is normal.     Breath sounds: Normal breath sounds.  Abdominal:     General: There is no distension.     Palpations: Abdomen is soft.     Tenderness: There is no abdominal tenderness.     Hernia: A hernia is present. Hernia is present in the left inguinal area and right inguinal area.     Comments: Reducible egg sized right inguinal hernia, small left sided hernia, reducible   Musculoskeletal:        General: Normal range of motion.     Cervical back: Normal range of motion.  Skin:    General: Skin is warm.  Neurological:     General: No focal deficit present.     Mental Status: He is alert.  Psychiatric:        Mood and Affect: Mood normal.         Thought Content: Thought content normal.        Judgment: Judgment normal.     Results: None    Assessment & Plan:  Seth Robinson is a 61 y.o. male with bilateral inguinal hernias.   Discussed the risk and benefits including, bleeding, infection, use of mesh, risk of recurrence, risk of nerve damage causing numbness or changes in sensation, risk of damage to the cord structures. The patient understands the risk and benefits of repair with mesh, and has decided to proceed.  We also discussed open versus Robotic assisted laparoscopic surgery and the use of mesh. We discussed that I do open repairs with mesh, and that this is considered equivalent to laparoscopic surgery. We discussed reasons for opting for robotic assisted laparoscopic surgery including if a bilateral repair is needed or if a patient has a recurrence after an open repair. We discussed the option of watch and wait in men and discussed that in 5 years some studies report that 40% of men have crossed over to needing a hernia repair because the hernia has become larger or symptomatic. We discussed that women are not appropriate candidate for watchful waiting due to the risk of femoral hernias.   Given his bilateral hernias will plan for robotic assisted laparoscopic inguinal hernia.      Lucretia Roers 12/18/2021, 2:16 PM

## 2021-12-21 NOTE — H&P (Signed)
[facility] History and Physical  Reason for Referral: Right inguinal hernia  Referring Physician: Self   Chief Complaint   New Patient (Initial Visit)     Seth Robinson is a 61 y.o. male.  HPI: Seth Robinson is a 62 yo who has noticed a right inguinal hernia for several years but says it is getting larger and causing him some discomfort mostly with sitting. It has never been stuck out or hard. He is otherwise healthy and active.   Past Medical History:  Diagnosis Date   Frequent headaches    GERD (gastroesophageal reflux disease)    Migraines    Seasonal allergies     Past Surgical History:  Procedure Laterality Date   ADENOIDECTOMY  1970   CERVICAL DISCECTOMY  Jan 2009   c5-c6,c6-c7    Family History  Problem Relation Age of Onset   Lung cancer Maternal Grandfather    Heart disease Maternal Grandfather    Prostate cancer Father    Testicular cancer Father    Kidney disease Paternal Uncle    Colon cancer Neg Hx     Social History   Tobacco Use   Smoking status: Never   Smokeless tobacco: Never  Substance Use Topics   Alcohol use: Yes    Alcohol/week: 5.0 standard drinks of alcohol    Types: 5 Cans of beer per week   Drug use: No    Medications: I have reviewed the patient's current medications. Allergies as of 12/18/2021   No Known Allergies      Medication List        Accurate as of December 18, 2021  2:16 PM. If you have any questions, ask your nurse or doctor.          STOP taking these medications    aspirin-acetaminophen-caffeine 250-250-65 MG tablet Commonly known as: EXCEDRIN MIGRAINE Stopped by: Virl Cagey, MD   cyclobenzaprine 10 MG tablet Commonly known as: FLEXERIL Stopped by: Virl Cagey, MD   cyclobenzaprine 15 MG 24 hr capsule Commonly known as: AMRIX Stopped by: Virl Cagey, MD   Fish Oil 1200 MG Caps Stopped by: Virl Cagey, MD   ibuprofen 200 MG tablet Commonly known as:  ADVIL Stopped by: Virl Cagey, MD   meloxicam 15 MG tablet Commonly known as: MOBIC Stopped by: Virl Cagey, MD   naproxen 500 MG tablet Commonly known as: NAPROSYN Stopped by: Virl Cagey, MD   naproxen sodium 220 MG tablet Commonly known as: ALEVE Stopped by: Virl Cagey, MD   oxyCODONE-acetaminophen 5-325 MG tablet Commonly known as: PERCOCET/ROXICET Stopped by: Virl Cagey, MD       TAKE these medications    GNP Mega Multi for Men Tabs Take 1 tablet by mouth daily.         ROS:  A comprehensive review of systems was negative except for: Gastrointestinal: positive for abdominal pain, nausea, and reflux symptoms Genitourinary: positive for frequency Musculoskeletal: positive for back pain and neck pain  Blood pressure 123/82, pulse 74, temperature 98.7 F (37.1 C), temperature source Oral, resp. rate 12, height 5\' 10"  (1.778 m), weight 200 lb (90.7 kg), SpO2 95 %. Physical Exam Vitals reviewed.  Constitutional:      Appearance: Normal appearance.  HENT:     Head: Normocephalic.     Nose: Nose normal.  Eyes:     Pupils: Pupils are equal, round, and reactive to light.  Cardiovascular:     Rate  and Rhythm: Normal rate and regular rhythm.  Pulmonary:     Effort: Pulmonary effort is normal.     Breath sounds: Normal breath sounds.  Abdominal:     General: There is no distension.     Palpations: Abdomen is soft.     Tenderness: There is no abdominal tenderness.     Hernia: A hernia is present. Hernia is present in the left inguinal area and right inguinal area.     Comments: Reducible egg sized right inguinal hernia, small left sided hernia, reducible   Musculoskeletal:        General: Normal range of motion.     Cervical back: Normal range of motion.  Skin:    General: Skin is warm.  Neurological:     General: No focal deficit present.     Mental Status: He is alert.  Psychiatric:        Mood and Affect: Mood normal.         Thought Content: Thought content normal.        Judgment: Judgment normal.     Results: None    Assessment & Plan:  Seth Robinson is a 61 y.o. male with bilateral inguinal hernias.   Discussed the risk and benefits including, bleeding, infection, use of mesh, risk of recurrence, risk of nerve damage causing numbness or changes in sensation, risk of damage to the cord structures. The patient understands the risk and benefits of repair with mesh, and has decided to proceed.  We also discussed open versus Robotic assisted laparoscopic surgery and the use of mesh. We discussed that I do open repairs with mesh, and that this is considered equivalent to laparoscopic surgery. We discussed reasons for opting for robotic assisted laparoscopic surgery including if a bilateral repair is needed or if a patient has a recurrence after an open repair. We discussed the option of watch and wait in men and discussed that in 5 years some studies report that 40% of men have crossed over to needing a hernia repair because the hernia has become larger or symptomatic. We discussed that women are not appropriate candidate for watchful waiting due to the risk of femoral hernias.   Given his bilateral hernias will plan for robotic assisted laparoscopic inguinal hernia.     Virl Cagey 12/18/2021, 2:16 PM

## 2022-01-11 NOTE — Patient Instructions (Signed)
Seth Robinson  01/11/2022     @PREFPERIOPPHARMACY @   Your procedure is scheduled on  01/16/2022.   Report to [facility] at  0600  A.M.   Call this number if you have problems the morning of surgery:  [phone]  If you experience any cold or flu symptoms such as cough, fever, chills, shortness of breath, etc. between now and your scheduled surgery, please notify us at the above number.   Remember:  Do not eat or drink after midnight.      Take these medicines the morning of surgery with A SIP OF WATER                                          None.     Do not wear jewelry, make-up or nail polish.  Do not wear lotions, powders, or perfumes, or deodorant.  Do not shave 48 hours prior to surgery.  Men may shave face and neck.  Do not bring valuables to the hospital.  [facility] is not responsible for any belongings or valuables.  Contacts, dentures or bridgework may not be worn into surgery.  Leave your suitcase in the car.  After surgery it may be brought to your room.  For patients admitted to the hospital, discharge time will be determined by your treatment team.  Patients discharged the day of surgery will not be allowed to drive home and must have someone with them for 24 hours.     Special instructions:   DO NOT smoke tobacco or vape for 24 hours before your procedure.  Please read over the following fact sheets that you were given. Pain Booklet, Coughing and Deep Breathing, Blood Transfusion Information, Surgical Site Infection Prevention, Anesthesia Post-op Instructions, and Care and Recovery After Surgery      Laparoscopic Inguinal Hernia Repair, Adult, Care After The following information offers guidance on how to care for yourself after your procedure. Your health care provider may also give you more specific instructions. If you have problems or questions, contact your health care provider. What can I expect after the procedure? After the  procedure, it is common to have: Pain. Swelling and bruising around the incision area. Scrotal swelling, in males. Some fluid or blood draining from your incisions. Follow these instructions at home: Medicines Take over-the-counter and prescription medicines only as told by your health care provider. Ask your health care provider if the medicine prescribed to you: Requires you to avoid driving or using machinery. Can cause constipation. You may need to take these actions to prevent or treat constipation: Drink enough fluid to keep your urine pale yellow. Take over-the-counter or prescription medicines. Eat foods that are high in fiber, such as beans, whole grains, and fresh fruits and vegetables. Limit foods that are high in fat and processed sugars, such as fried or sweet foods. Incision care  Follow instructions from your health care provider about how to take care of your incisions. Make sure you: Wash your hands with soap and water for at least 20 seconds before and after you change your bandage (dressing). If soap and water are not available, use hand sanitizer. Change your dressing as told by your health care provider. Leave stitches (sutures), skin glue, or adhesive strips in place. These skin closures may need to stay in place for 2 weeks or  longer. If adhesive strip edges start to loosen and curl up, you may trim the loose edges. Do not remove adhesive strips completely unless your health care provider tells you to do that. Check your incision area every day for signs of infection. Check for: More redness, swelling, or pain. More fluid or blood. Warmth. Pus or a bad smell. Wear loose, soft clothing while your incisions heal. Managing pain and swelling If directed, put ice on the painful or swollen areas. To do this: Put ice in a plastic bag. Place a towel between your skin and the bag. Leave the ice on for 20 minutes, 2-3 times a day. Remove the ice if your skin turns bright  red. This is very important. If you cannot feel pain, heat, or cold, you have a greater risk of damage to the area.  Activity Do not lift anything that is heavier than 10 lb (4.5 kg), or the limit that you are told, until your health care provider says that it is safe. Ask your health care provider what activities are safe for you. A lot of activity during the first week after surgery can increase pain and swelling. For 1 week after your procedure: Avoid activities that take a lot of effort, such as exercise or sports. You may walk and climb stairs as needed for daily activity, but avoid long walks or climbing stairs for exercise. General instructions If you were given a sedative during the procedure, it can affect you for several hours. Do not drive or operate machinery until your health care provider says that it is safe. Do not take baths, swim, or use a hot tub until your health care provider approves. Ask your health care provider if you may take showers. You may only be allowed to take sponge baths. Do not use any products that contain nicotine or tobacco. These products include cigarettes, chewing tobacco, and vaping devices, such as e-cigarettes. If you need help quitting, ask your health care provider. Keep all follow-up visits. This is important. Contact a health care provider if: You have any of these signs of infection: More redness, swelling, or pain around your incisions or your groin area. More fluid or blood coming from an incision. Warmth coming from an incision. Pus or a bad smell coming from an incision. A fever or chills. You have more swelling in your scrotum, if you are male. You have severe pain and medicines do not help. You have abdominal pain or swelling. You cannot urinate or have a bowel movement. You faint or feel dizzy. You have nausea and vomiting. Get help right away if: You have redness, warmth, or pain in your leg. You have chest pain. You have problems  breathing. These symptoms may represent a serious problem that is an emergency. Do not wait to see if the symptoms will go away. Get medical help right away. Call your local emergency services (911 in the [place] Do not drive yourself to the hospital. Summary Pain, swelling, and bruising are common after the procedure. Check your incision area every day for signs of infection, such as more redness, swelling, or pain. Put ice on painful or swollen areas for 20 minutes, 2-3 times a day. This information is not intended to replace advice given to you by your health care provider. Make sure you discuss any questions you have with your health care provider. Document Revised: 09/14/2019 Document Reviewed: 09/14/2019 Elsevier Patient Education  2023 [employer] General Anesthesia, Adult, Care After The following information  offers guidance on how to care for yourself after your procedure. Your health care provider may also give you more specific instructions. If you have problems or questions, contact your health care provider. What can I expect after the procedure? After the procedure, it is common for people to: Have pain or discomfort at the IV site. Have nausea or vomiting. Have a sore throat or hoarseness. Have trouble concentrating. Feel cold or chills. Feel weak, sleepy, or tired (fatigue). Have soreness and body aches. These can affect parts of the body that were not involved in surgery. Follow these instructions at home: For the time period you were told by your health care provider:  Rest. Do not participate in activities where you could fall or become injured. Do not drive or use machinery. Do not drink alcohol. Do not take sleeping pills or medicines that cause drowsiness. Do not make important decisions or sign legal documents. Do not take care of children on your own. General instructions Drink enough fluid to keep your urine pale yellow. If you have sleep apnea, surgery and  certain medicines can increase your risk for breathing problems. Follow instructions from your health care provider about wearing your sleep device: Anytime you are sleeping, including during daytime naps. While taking prescription pain medicines, sleeping medicines, or medicines that make you drowsy. Return to your normal activities as told by your health care provider. Ask your health care provider what activities are safe for you. Take over-the-counter and prescription medicines only as told by your health care provider. Do not use any products that contain nicotine or tobacco. These products include cigarettes, chewing tobacco, and vaping devices, such as e-cigarettes. These can delay incision healing after surgery. If you need help quitting, ask your health care provider. Contact a health care provider if: You have nausea or vomiting that does not get better with medicine. You vomit every time you eat or drink. You have pain that does not get better with medicine. You cannot urinate or have bloody urine. You develop a skin rash. You have a fever. Get help right away if: You have trouble breathing. You have chest pain. You vomit blood. These symptoms may be an emergency. Get help right away. Call 911. Do not wait to see if the symptoms will go away. Do not drive yourself to the hospital. Summary After the procedure, it is common to have a sore throat, hoarseness, nausea, vomiting, or to feel weak, sleepy, or fatigue. For the time period you were told by your health care provider, do not drive or use machinery. Get help right away if you have difficulty breathing, have chest pain, or vomit blood. These symptoms may be an emergency. This information is not intended to replace advice given to you by your health care provider. Make sure you discuss any questions you have with your health care provider. Document Revised: 04/13/2021 Document Reviewed: 04/13/2021 Elsevier Patient Education   2023 [employer] Inc. How to Use Chlorhexidine Before Surgery Chlorhexidine gluconate (CHG) is a germ-killing (antiseptic) solution that is used to clean the skin. It can get rid of the bacteria that normally live on the skin and can keep them away for about 24 hours. To clean your skin with CHG, you may be given: A CHG solution to use in the shower or as part of a sponge bath. A prepackaged cloth that contains CHG. Cleaning your skin with CHG may help lower the risk for infection: While you are staying in the intensive care unit  of the hospital. If you have a vascular access, such as a central line, to provide short-term or long-term access to your veins. If you have a catheter to drain urine from your bladder. If you are on a ventilator. A ventilator is a machine that helps you breathe by moving air in and out of your lungs. After surgery. What are the risks? Risks of using CHG include: A skin reaction. Hearing loss, if CHG gets in your ears and you have a perforated eardrum. Eye injury, if CHG gets in your eyes and is not rinsed out. The CHG product catching fire. Make sure that you avoid smoking and flames after applying CHG to your skin. Do not use CHG: If you have a chlorhexidine allergy or have previously reacted to chlorhexidine. On babies younger than 35 months of age. How to use CHG solution Use CHG only as told by your health care provider, and follow the instructions on the label. Use the full amount of CHG as directed. Usually, this is one bottle. During a shower Follow these steps when using CHG solution during a shower (unless your health care provider gives you different instructions): Start the shower. Use your normal soap and shampoo to wash your face and hair. Turn off the shower or move out of the shower stream. Pour the CHG onto a clean washcloth. Do not use any type of brush or rough-edged sponge. Starting at your neck, lather your body down to your toes. Make sure you  follow these instructions: If you will be having surgery, pay special attention to the part of your body where you will be having surgery. Scrub this area for at least 1 minute. Do not use CHG on your head or face. If the solution gets into your ears or eyes, rinse them well with water. Avoid your genital area. Avoid any areas of skin that have broken skin, cuts, or scrapes. Scrub your back and under your arms. Make sure to wash skin folds. Let the lather sit on your skin for 1-2 minutes or as long as told by your health care provider. Thoroughly rinse your entire body in the shower. Make sure that all body creases and crevices are rinsed well. Dry off with a clean towel. Do not put any substances on your body afterward--such as powder, lotion, or perfume--unless you are told to do so by your health care provider. Only use lotions that are recommended by the manufacturer. Put on clean clothes or pajamas. If it is the night before your surgery, sleep in clean sheets.  During a sponge bath Follow these steps when using CHG solution during a sponge bath (unless your health care provider gives you different instructions): Use your normal soap and shampoo to wash your face and hair. Pour the CHG onto a clean washcloth. Starting at your neck, lather your body down to your toes. Make sure you follow these instructions: If you will be having surgery, pay special attention to the part of your body where you will be having surgery. Scrub this area for at least 1 minute. Do not use CHG on your head or face. If the solution gets into your ears or eyes, rinse them well with water. Avoid your genital area. Avoid any areas of skin that have broken skin, cuts, or scrapes. Scrub your back and under your arms. Make sure to wash skin folds. Let the lather sit on your skin for 1-2 minutes or as long as told by your health care provider.  Using a different clean, wet washcloth, thoroughly rinse your entire body.  Make sure that all body creases and crevices are rinsed well. Dry off with a clean towel. Do not put any substances on your body afterward--such as powder, lotion, or perfume--unless you are told to do so by your health care provider. Only use lotions that are recommended by the manufacturer. Put on clean clothes or pajamas. If it is the night before your surgery, sleep in clean sheets. How to use CHG prepackaged cloths Only use CHG cloths as told by your health care provider, and follow the instructions on the label. Use the CHG cloth on clean, dry skin. Do not use the CHG cloth on your head or face unless your health care provider tells you to. When washing with the CHG cloth: Avoid your genital area. Avoid any areas of skin that have broken skin, cuts, or scrapes. Before surgery Follow these steps when using a CHG cloth to clean before surgery (unless your health care provider gives you different instructions): Using the CHG cloth, vigorously scrub the part of your body where you will be having surgery. Scrub using a back-and-forth motion for 3 minutes. The area on your body should be completely wet with CHG when you are done scrubbing. Do not rinse. Discard the cloth and let the area air-dry. Do not put any substances on the area afterward, such as powder, lotion, or perfume. Put on clean clothes or pajamas. If it is the night before your surgery, sleep in clean sheets.  For general bathing Follow these steps when using CHG cloths for general bathing (unless your health care provider gives you different instructions). Use a separate CHG cloth for each area of your body. Make sure you wash between any folds of skin and between your fingers and toes. Wash your body in the following order, switching to a new cloth after each step: The front of your neck, shoulders, and chest. Both of your arms, under your arms, and your hands. Your stomach and groin area, avoiding the genitals. Your right  leg and foot. Your left leg and foot. The back of your neck, your back, and your buttocks. Do not rinse. Discard the cloth and let the area air-dry. Do not put any substances on your body afterward--such as powder, lotion, or perfume--unless you are told to do so by your health care provider. Only use lotions that are recommended by the manufacturer. Put on clean clothes or pajamas. Contact a health care provider if: Your skin gets irritated after scrubbing. You have questions about using your solution or cloth. You swallow any chlorhexidine. Call your local poison control center (316-014-7463 in the [place] Get help right away if: Your eyes itch badly, or they become very red or swollen. Your skin itches badly and is red or swollen. Your hearing changes. You have trouble seeing. You have swelling or tingling in your mouth or throat. You have trouble breathing. These symptoms may represent a serious problem that is an emergency. Do not wait to see if the symptoms will go away. Get medical help right away. Call your local emergency services (911 in the [place] Do not drive yourself to the hospital. Summary Chlorhexidine gluconate (CHG) is a germ-killing (antiseptic) solution that is used to clean the skin. Cleaning your skin with CHG may help to lower your risk for infection. You may be given CHG to use for bathing. It may be in a bottle or in a prepackaged cloth to use on  your skin. Carefully follow your health care provider's instructions and the instructions on the product label. Do not use CHG if you have a chlorhexidine allergy. Contact your health care provider if your skin gets irritated after scrubbing. This information is not intended to replace advice given to you by your health care provider. Make sure you discuss any questions you have with your health care provider. Document Revised: 05/14/2021 Document Reviewed: 03/27/2020 Elsevier Patient Education  [place].

## 2022-01-14 ENCOUNTER — Encounter (HOSPITAL_COMMUNITY)
Admission: RE | Admit: 2022-01-14 | Discharge: 2022-01-14 | Disposition: A | Discharge status: Home / Self Care | Payer: 59 | Source: Ambulatory Visit | Attending: General Surgery | Admitting: General Surgery

## 2022-01-14 ENCOUNTER — Encounter (HOSPITAL_COMMUNITY): Payer: Self-pay

## 2022-01-14 VITALS — BP 136/92 | HR 70 | Temp 97.5°F | Resp 18 | Ht 70.0 in | Wt 200.0 lb

## 2022-01-14 DIAGNOSIS — Z01812 Encounter for preprocedural laboratory examination: Secondary | ICD-10-CM | POA: Insufficient documentation

## 2022-01-14 DIAGNOSIS — Z01818 Encounter for other preprocedural examination: Secondary | ICD-10-CM

## 2022-01-14 HISTORY — DX: Unspecified osteoarthritis, unspecified site: M19.90

## 2022-01-14 LAB — CBC WITH DIFFERENTIAL/PLATELET
Abs Immature Granulocytes: 0.01 10*3/uL (ref 0.00–0.07)
Basophils Absolute: 0.1 10*3/uL (ref 0.0–0.1)
Basophils Relative: 1 %
Eosinophils Absolute: 0.2 10*3/uL (ref 0.0–0.5)
Eosinophils Relative: 3 %
HCT: 43.8 % (ref 39.0–52.0)
Hemoglobin: 15 g/dL (ref 13.0–17.0)
Immature Granulocytes: 0 %
Lymphocytes Relative: 39 %
Lymphs Abs: 2.9 10*3/uL (ref 0.7–4.0)
MCH: 30.6 pg (ref 26.0–34.0)
MCHC: 34.2 g/dL (ref 30.0–36.0)
MCV: 89.4 fL (ref 80.0–100.0)
Monocytes Absolute: 0.7 10*3/uL (ref 0.1–1.0)
Monocytes Relative: 10 %
Neutro Abs: 3.6 10*3/uL (ref 1.7–7.7)
Neutrophils Relative %: 47 %
Platelets: 252 10*3/uL (ref 150–400)
RBC: 4.9 MIL/uL (ref 4.22–5.81)
RDW: 12.6 % (ref 11.5–15.5)
WBC: 7.5 10*3/uL (ref 4.0–10.5)
nRBC: 0 % (ref 0.0–0.2)

## 2022-01-14 LAB — TYPE AND SCREEN
ABO/RH(D): O POS
Antibody Screen: NEGATIVE

## 2022-01-16 ENCOUNTER — Encounter (HOSPITAL_COMMUNITY): Payer: Self-pay | Admitting: General Surgery

## 2022-01-16 ENCOUNTER — Other Ambulatory Visit: Payer: Self-pay

## 2022-01-16 ENCOUNTER — Ambulatory Visit (HOSPITAL_COMMUNITY): Payer: 59 | Admitting: Certified Registered Nurse Anesthetist

## 2022-01-16 ENCOUNTER — Encounter (HOSPITAL_COMMUNITY)
Admission: RE | Disposition: A | Discharge status: Home / Self Care | Payer: Self-pay | Source: Home / Self Care | Attending: General Surgery

## 2022-01-16 ENCOUNTER — Ambulatory Visit (HOSPITAL_BASED_OUTPATIENT_CLINIC_OR_DEPARTMENT_OTHER): Payer: 59 | Admitting: Certified Registered Nurse Anesthetist

## 2022-01-16 ENCOUNTER — Ambulatory Visit (HOSPITAL_COMMUNITY)
Admission: RE | Admit: 2022-01-16 | Discharge: 2022-01-16 | Disposition: A | Discharge status: Home / Self Care | Payer: 59 | Attending: General Surgery | Admitting: General Surgery

## 2022-01-16 DIAGNOSIS — K4020 Bilateral inguinal hernia, without obstruction or gangrene, not specified as recurrent: Secondary | ICD-10-CM | POA: Insufficient documentation

## 2022-01-16 DIAGNOSIS — K219 Gastro-esophageal reflux disease without esophagitis: Secondary | ICD-10-CM | POA: Insufficient documentation

## 2022-01-16 DIAGNOSIS — T884XXA Failed or difficult intubation, initial encounter: Secondary | ICD-10-CM

## 2022-01-16 DIAGNOSIS — G43909 Migraine, unspecified, not intractable, without status migrainosus: Secondary | ICD-10-CM | POA: Diagnosis not present

## 2022-01-16 DIAGNOSIS — K4090 Unilateral inguinal hernia, without obstruction or gangrene, not specified as recurrent: Secondary | ICD-10-CM

## 2022-01-16 HISTORY — PX: XI ROBOTIC ASSISTED INGUINAL HERNIA REPAIR WITH MESH: SHX6706

## 2022-01-16 HISTORY — DX: Failed or difficult intubation, initial encounter: T88.4XXA

## 2022-01-16 LAB — ABO/RH: ABO/RH(D): O POS

## 2022-01-16 SURGERY — REPAIR, HERNIA, INGUINAL, ROBOT-ASSISTED, LAPAROSCOPIC, USING MESH
Anesthesia: General | Site: Abdomen | Laterality: Bilateral

## 2022-01-16 MED ORDER — ONDANSETRON HCL 4 MG/2ML IJ SOLN
INTRAMUSCULAR | Status: AC
  Filled 2022-01-16: qty 2

## 2022-01-16 MED ORDER — CHLORHEXIDINE GLUCONATE 0.12 % MT SOLN
15.0000 mL | Freq: Once | OROMUCOSAL | Status: AC
  Administered 2022-01-16: 15 mL via OROMUCOSAL

## 2022-01-16 MED ORDER — PROPOFOL 10 MG/ML IV BOLUS
INTRAVENOUS | Status: DC | PRN
  Administered 2022-01-16: 150 mg via INTRAVENOUS
  Administered 2022-01-16: 50 mg via INTRAVENOUS

## 2022-01-16 MED ORDER — KETOROLAC TROMETHAMINE 30 MG/ML IJ SOLN
INTRAMUSCULAR | Status: AC
  Filled 2022-01-16: qty 1

## 2022-01-16 MED ORDER — CEFAZOLIN SODIUM-DEXTROSE 2-4 GM/100ML-% IV SOLN
2.0000 g | INTRAVENOUS | Status: AC
  Administered 2022-01-16: 2 g via INTRAVENOUS
  Filled 2022-01-16: qty 100

## 2022-01-16 MED ORDER — ROCURONIUM BROMIDE 10 MG/ML (PF) SYRINGE
PREFILLED_SYRINGE | INTRAVENOUS | Status: DC | PRN
  Administered 2022-01-16: 100 mg via INTRAVENOUS
  Administered 2022-01-16: 50 mg via INTRAVENOUS
  Administered 2022-01-16: 30 mg via INTRAVENOUS
  Administered 2022-01-16: 50 mg via INTRAVENOUS

## 2022-01-16 MED ORDER — STERILE WATER FOR IRRIGATION IR SOLN
Status: DC | PRN
  Administered 2022-01-16: 500 mL

## 2022-01-16 MED ORDER — LIDOCAINE HCL (PF) 0.5 % IJ SOLN
INTRAMUSCULAR | Status: AC
  Filled 2022-01-16: qty 50

## 2022-01-16 MED ORDER — PROPOFOL 10 MG/ML IV BOLUS
INTRAVENOUS | Status: AC
  Filled 2022-01-16: qty 20

## 2022-01-16 MED ORDER — LIDOCAINE HCL (PF) 2 % IJ SOLN
INTRAMUSCULAR | Status: DC | PRN
  Administered 2022-01-16: .5 mg/kg/h via INTRADERMAL

## 2022-01-16 MED ORDER — ONDANSETRON HCL 4 MG PO TABS: 4.0000 mg | ORAL_TABLET | Freq: Every day | ORAL | 1 refills | Status: DC | PRN

## 2022-01-16 MED ORDER — DEXAMETHASONE SODIUM PHOSPHATE 10 MG/ML IJ SOLN
INTRAMUSCULAR | Status: AC
  Filled 2022-01-16: qty 1

## 2022-01-16 MED ORDER — ROCURONIUM BROMIDE 10 MG/ML (PF) SYRINGE
PREFILLED_SYRINGE | INTRAVENOUS | Status: AC
  Filled 2022-01-16: qty 10

## 2022-01-16 MED ORDER — ONDANSETRON HCL 4 MG/2ML IJ SOLN: 4.0000 mg | Freq: Once | INTRAMUSCULAR | Status: DC | PRN

## 2022-01-16 MED ORDER — LACTATED RINGERS IV SOLN
INTRAVENOUS | Status: DC
  Administered 2022-01-16: 1000 mL via INTRAVENOUS

## 2022-01-16 MED ORDER — OXYCODONE HCL 5 MG/5ML PO SOLN: 5.0000 mg | Freq: Once | ORAL | Status: AC | PRN

## 2022-01-16 MED ORDER — LIDOCAINE 2% (20 MG/ML) 5 ML SYRINGE
INTRAMUSCULAR | Status: DC | PRN
  Administered 2022-01-16: 20 mg via INTRAVENOUS

## 2022-01-16 MED ORDER — KETOROLAC TROMETHAMINE 30 MG/ML IJ SOLN
INTRAMUSCULAR | Status: DC | PRN
  Administered 2022-01-16: 30 mg via INTRAVENOUS

## 2022-01-16 MED ORDER — OXYCODONE HCL 5 MG PO TABS
5.0000 mg | ORAL_TABLET | Freq: Once | ORAL | Status: AC | PRN
  Administered 2022-01-16: 5 mg via ORAL
  Filled 2022-01-16: qty 1

## 2022-01-16 MED ORDER — FENTANYL CITRATE (PF) 250 MCG/5ML IJ SOLN
INTRAMUSCULAR | Status: DC | PRN
  Administered 2022-01-16: 25 ug via INTRAVENOUS
  Administered 2022-01-16 (×3): 50 ug via INTRAVENOUS

## 2022-01-16 MED ORDER — PHENYLEPHRINE HCL-NACL 20-0.9 MG/250ML-% IV SOLN
INTRAVENOUS | Status: AC
  Filled 2022-01-16: qty 250

## 2022-01-16 MED ORDER — DEXAMETHASONE SODIUM PHOSPHATE 10 MG/ML IJ SOLN
INTRAMUSCULAR | Status: DC | PRN
  Administered 2022-01-16: 10 mg via INTRAVENOUS

## 2022-01-16 MED ORDER — CHLORHEXIDINE GLUCONATE CLOTH 2 % EX PADS: 6.0000 | MEDICATED_PAD | Freq: Once | CUTANEOUS | Status: DC

## 2022-01-16 MED ORDER — LIDOCAINE HCL (PF) 2 % IJ SOLN
INTRAMUSCULAR | Status: AC
  Filled 2022-01-16: qty 5

## 2022-01-16 MED ORDER — FENTANYL CITRATE (PF) 250 MCG/5ML IJ SOLN
INTRAMUSCULAR | Status: AC
  Filled 2022-01-16: qty 5

## 2022-01-16 MED ORDER — FENTANYL CITRATE PF 50 MCG/ML IJ SOSY
25.0000 ug | PREFILLED_SYRINGE | INTRAMUSCULAR | Status: DC | PRN
  Administered 2022-01-16 (×2): 50 ug via INTRAVENOUS
  Filled 2022-01-16 (×2): qty 1

## 2022-01-16 MED ORDER — ORAL CARE MOUTH RINSE: 15.0000 mL | Freq: Once | OROMUCOSAL | Status: AC

## 2022-01-16 MED ORDER — 0.9 % SODIUM CHLORIDE (POUR BTL) OPTIME
TOPICAL | Status: DC | PRN
  Administered 2022-01-16: 500 mL

## 2022-01-16 MED ORDER — SUGAMMADEX SODIUM 200 MG/2ML IV SOLN
INTRAVENOUS | Status: DC | PRN
  Administered 2022-01-16: 362.8 mg via INTRAVENOUS

## 2022-01-16 MED ORDER — LIDOCAINE HCL (PF) 2 % IJ SOLN: INTRAMUSCULAR | Status: DC | PRN

## 2022-01-16 MED ORDER — ONDANSETRON HCL 4 MG/2ML IJ SOLN
INTRAMUSCULAR | Status: DC | PRN
  Administered 2022-01-16: 4 mg via INTRAVENOUS

## 2022-01-16 MED ORDER — OXYCODONE HCL 5 MG PO TABS: 5.0000 mg | ORAL_TABLET | ORAL | 0 refills | Status: DC | PRN

## 2022-01-16 MED ORDER — BUPIVACAINE LIPOSOME 1.3 % IJ SUSP
INTRAMUSCULAR | Status: DC | PRN
  Administered 2022-01-16: 20 mL

## 2022-01-16 MED ORDER — MIDAZOLAM HCL 5 MG/5ML IJ SOLN
INTRAMUSCULAR | Status: DC | PRN
  Administered 2022-01-16: 2 mg via INTRAVENOUS

## 2022-01-16 MED ORDER — ACETAMINOPHEN 500 MG PO TABS
1000.0000 mg | ORAL_TABLET | ORAL | Status: AC
  Administered 2022-01-16: 1000 mg via ORAL
  Filled 2022-01-16: qty 2

## 2022-01-16 MED ORDER — BUPIVACAINE LIPOSOME 1.3 % IJ SUSP
INTRAMUSCULAR | Status: AC
  Filled 2022-01-16: qty 20

## 2022-01-16 MED ORDER — MIDAZOLAM HCL 2 MG/2ML IJ SOLN
INTRAMUSCULAR | Status: AC
  Filled 2022-01-16: qty 2

## 2022-01-16 SURGICAL SUPPLY — 46 items
COVER MAYO STAND XLG (MISCELLANEOUS) ×1 IMPLANT
COVER SURGICAL LIGHT HANDLE (MISCELLANEOUS) ×1 IMPLANT
COVER TIP SHEARS 8 DVNC (MISCELLANEOUS) ×1 IMPLANT
COVER TIP SHEARS 8MM DA VINCI (MISCELLANEOUS) ×1
DEFOGGER SCOPE WARMER CLEARIFY (MISCELLANEOUS) IMPLANT
DERMABOND ADVANCED .7 DNX12 (GAUZE/BANDAGES/DRESSINGS) ×1 IMPLANT
DRAPE ARM DVNC X/XI (DISPOSABLE) ×3 IMPLANT
DRAPE COLUMN DVNC XI (DISPOSABLE) ×1 IMPLANT
DRAPE DA VINCI XI ARM (DISPOSABLE) ×3
DRAPE DA VINCI XI COLUMN (DISPOSABLE) ×1
DRAPE HALF SHEET 40X57 (DRAPES) ×1 IMPLANT
ELECT REM PT RETURN 9FT ADLT (ELECTROSURGICAL) ×1
ELECTRODE REM PT RTRN 9FT ADLT (ELECTROSURGICAL) ×1 IMPLANT
GAUZE SPONGE 4X4 12PLY STRL (GAUZE/BANDAGES/DRESSINGS) ×1 IMPLANT
GAUZE XEROFORM 1X8 LF (GAUZE/BANDAGES/DRESSINGS) IMPLANT
GLOVE BIO SURGEON STRL SZ 6.5 (GLOVE) ×2 IMPLANT
GLOVE BIOGEL PI IND STRL 6.5 (GLOVE) ×2 IMPLANT
GLOVE BIOGEL PI IND STRL 7.0 (GLOVE) ×2 IMPLANT
GLOVE SURG SS PI 7.0 STRL IVOR (GLOVE) IMPLANT
GOWN STRL REUS W/TWL LRG LVL3 (GOWN DISPOSABLE) ×2 IMPLANT
KIT PINK PAD W/HEAD ARE REST (MISCELLANEOUS) ×1
KIT PINK PAD W/HEAD ARM REST (MISCELLANEOUS) ×1 IMPLANT
KIT TURNOVER KIT A (KITS) ×1 IMPLANT
MANIFOLD NEPTUNE II (INSTRUMENTS) ×1 IMPLANT
MESH 3DMAX MID 4X6 LT LRG (Mesh General) IMPLANT
MESH 3DMAX MID 4X6 RT LRG (Mesh General) IMPLANT
NDL HYPO 21X1.5 SAFETY (NEEDLE) IMPLANT
NDL INSUFFLATION 14GA 120MM (NEEDLE) ×1 IMPLANT
NEEDLE HYPO 21X1.5 SAFETY (NEEDLE) ×1 IMPLANT
NEEDLE INSUFFLATION 14GA 120MM (NEEDLE) ×1 IMPLANT
OBTURATOR OPTICAL STANDARD 8MM (TROCAR) ×1
OBTURATOR OPTICAL STND 8 DVNC (TROCAR) ×1
OBTURATOR OPTICALSTD 8 DVNC (TROCAR) ×1 IMPLANT
PACK LAP CHOLECYSTECTOMY (MISCELLANEOUS) ×1 IMPLANT
SEAL CANN UNIV 5-8 DVNC XI (MISCELLANEOUS) ×3 IMPLANT
SEAL XI 5MM-8MM UNIVERSAL (MISCELLANEOUS) ×3
SET TUBE SMOKE EVAC HIGH FLOW (TUBING) ×1 IMPLANT
SOL PREP POV-IOD 4OZ 10% (MISCELLANEOUS) ×1 IMPLANT
SUT MNCRL AB 4-0 PS2 18 (SUTURE) ×1 IMPLANT
SUT V-LOC 90 ABS 3-0 VLT  V-20 (SUTURE) ×2
SUT V-LOC 90 ABS 3-0 VLT V-20 (SUTURE) ×2 IMPLANT
SUT VIC AB 2-0 CT2 27 (SUTURE) IMPLANT
TAPE TRANSPORE STRL 2 31045 (GAUZE/BANDAGES/DRESSINGS) ×1 IMPLANT
TRAY FOL W/BAG SLVR 16FR STRL (SET/KITS/TRAYS/PACK) ×1 IMPLANT
TRAY FOLEY W/BAG SLVR 16FR LF (SET/KITS/TRAYS/PACK) ×1
WATER STERILE IRR 500ML POUR (IV SOLUTION) ×1 IMPLANT

## 2022-01-16 NOTE — Discharge Instructions (Signed)
Discharge Robotic Assisted Laparoscopic Surgery Instructions:  Common Complaints: Right shoulder pain is common after laparoscopic surgery.  This is secondary to the gas used in the surgery being trapped under the diaphragm.  Walk to help your body absorb the gas. This will improve in a few days. Pain at the port sites are common, especially the larger port sites. This will improve with time.  Some nausea is common and poor appetite. The main goal is to stay hydrated the first few days after surgery.  You may have some swelling and bruising in your pubic area and groin area after inguinal hernia repair. You could have some swelling in her scrotum that will improve with time.  You can ice the area as needed after surgery.   Diet/ Activity: Diet as tolerated. You may not have an appetite, but it is important to stay hydrated.  Drink 64 ounces of water a day. Your appetite will return with time.  Shower per your regular routine daily.  Do not take hot showers. Take warm showers that are less than 10 minutes. Rest and listen to your body, but do not remain in bed all day.  Walk everyday for at least 15-20 minutes. Deep cough and move around every 1-2 hours in the first few days after surgery.  Do not lift > 10 lbs, perform excessive bending, pushing, pulling, squatting for 1-2 weeks after surgery.  Do not pick at the dermabond glue on your incision sites.  This glue film will remain in place for 1-2 weeks and will start to peel off.  Do not place lotions or balms on your incision unless instructed to specifically by Dr. Henreitta Leber.    Pain Expectations and Narcotics: -After surgery you will have pain associated with your incisions and this is normal. The pain is muscular and nerve pain, and will get better with time. -You are encouraged and expected to take non narcotic medications like tylenol and ibuprofen (when able) to treat pain as multiple modalities can aid with pain treatment. -Narcotics  are only used when pain is severe or there is breakthrough pain. -You are not expected to have a pain score of 0 after surgery, as we cannot prevent pain. A pain score of 3-4 that allows you to be functional, move, walk, and tolerate some activity is the goal. The pain will continue to improve over the days after surgery and is dependent on your surgery. -Due to  law, we are only able to give a certain amount of pain medication to treat post operative pain, and we only give additional narcotics on a patient by patient basis.  -For most laparoscopic surgery, studies have shown that the majority of patients only need 10-15 narcotic pills, and for open surgeries most patients only need 15-20.   -Having appropriate expectations of pain and knowledge of pain management with non narcotics is important as we do not want anyone to become addicted to narcotic pain medication.  -Using ice packs in the first 48 hours and heating pads after 48 hours, wearing an abdominal binder (when recommended), and using over the counter medications are all ways to help with pain management.   -Simple acts like meditation and mindfulness practices after surgery can also help with pain control and research has proven the benefit of these practices.  Medication: Take tylenol and ibuprofen as needed for pain control, alternating every 4-6 hours.  Example:  Tylenol 1000mg  @ 6am, 12noon, 6pm, (Do not exceed 4000mg  of tylenol a day).  Ibuprofen 800mg  @ 9am, 3pm, 9pm, 3am (Do not exceed 3600mg  of ibuprofen a day).  Take Roxicodone for breakthrough pain every 4 hours.  Take Colace for constipation related to narcotic pain medication. If you do not have a bowel movement in 2 days, take Miralax over the counter.  Drink plenty of water to also prevent constipation.   Contact Information: If you have questions or concerns, please call our office, [phone], Monday- Thursday 8AM-5PM and Friday 8AM-12Noon.  If it is after  hours or on the weekend, please call [facility], [phone], [phone], and ask to speak to the surgeon on call for Dr. [phone] at [facility]

## 2022-01-16 NOTE — Progress Notes (Signed)
[facility]  Updated his wife. Needs to urinate before leaving. Will see in 4 weeks.  Seth Greenhouse, MD [facility] [address] [name], [place] [address] [phone] (office)

## 2022-01-16 NOTE — Op Note (Signed)
[facility] Operative Note  01/16/22  Preoperative Diagnosis: Bilateral inguinal hernia   Postoperative Diagnosis: Bilateral direct inguinal hernias    Procedure(s) Performed: Robotic assisted laparoscopic bilateral direct inguinal hernia repair with mesh   Surgeon: Leatrice Jewels. Henreitta Leber, MD   Assistants: No qualified resident was available    Anesthesia: General endotracheal   Anesthesiologist: Dr. Johnnette Litter, MD   Specimens: None   Estimated Blood Loss: Minimal   Blood Replacement: None    Complications: None   Wound Class:Clean   Operative Indications: The patient has a bilateral inguinal hernia that is symptomatic and he wants repaired. We discussed robotic assisted laparoscopic inguinal hernia repair and risk of bleeding, infection, issues with chronic pain post operatively, use of mesh, risk of recurrence, chance of needing to repair a bilateral hernia, risk of injury to bowel or bladder, and risk of injury to cord structures for male patients.   Findings: Bilateral direct hernias Vas Deferens and cord structures identified and preserved Bard 3D Max Medium Weight Mesh, large size Hemostasis achieved   Right sided direct  Left sided direct   Procedure: The patient was taken to the operating room and placed supine. General endotracheal anesthesia was induced. Intravenous antibiotics were administered per protocol.  A foley catheter was placed and a orogastric tube positioned to decompress the stomach. The abdomen was prepared and draped in the usual sterile fashion.   Veress needle inserted supraumbilical  Saline drop test noted to be positive with gradual increase in pressure after initiation of gas insufflation.  15 mm of pressure was achieved prior to removing the Veress needle. I then attempted to place a 8 mm port via the Optiview technique 20 cm above the pubic symphysis. I encountered the falciform, and did not want to get hung up in the falciform. So  I opted to go just supraumbilical with a 55mm port, enlarging that stab incision and got into the abdomen just above the umbilicus. Inspection of the area afterwards noted no injury to the surrounding organs during insertion of the needle and the port.  The falciform was a little bloody but was not increasing in size. I then placed a 8 mm port through the site supraumbilical (20cm above the pubic symphysis) while watching it enter so I was just left and lateral to the falciform.  2 port sites were marked 8 cm to the lateral sides of this port, and a 8 mm robotic port was placed on the left side. I removed the supraumbilical port, stuffed xeroform into the whole, and placed that 8 mm port on the right side under direct supervision. The [employer] platform was then brought into the operative field and docked to the ports.  Examination of the abdominal cavity noted a bilateral direct inguinal hernia.  On the right side, a peritoneal flap was created approximately 6 cm cephalad to the defect by using scissors with electrocautery.  Dissection was carried down towards the pubic tubercle, developing the myopectineal orifice view. Laterally the flap was carried towards the ASIS.  A direct hernia sac was noted, which carefully dissected away from the adjacent tissues to be fully reduced out of hernia cavity.  Any bleeding was controlled with combination of electrocautery and manual pressure.    After confirming adequate dissection and the peritoneal reflection completely down and away from the cord structures at the deep ring, a large Bard 3DMax Medium weight mesh was placed within the anterior abdominal wall and secured in place using 2-0 Vicryl  immediately above the pubic tubercle and superior on the abdominal wall ensuring no vessels or nerves were incorporated.  After noting proper placement of the mesh with the peritoneal reflection deep to it, the previously created peritoneal flap was secured back up to the  anterior abdominal wall using running 3-0 V-Lock. Any holes created in the peritoneal flap were closed with a figure of 8 Vicryl.   Attention was then turned to the left side, a peritoneal flap was created approximately 6 cm cephalad to the defect by using scissors with electrocautery.  Dissection was carried down towards the pubic tubercle, developing the myopectineal orifice view. Laterally the flap was carried towards the ASIS.  A direct hernia sac was noted, which carefully dissected away from the adjacent tissues to be fully reduced out of hernia cavity.  Any bleeding was controlled with combination of electrocautery and manual pressure.    After confirming adequate dissection and the peritoneal reflection completely down and away from the cord structures at the deep ring, a large Bard 3DMax Medium weight mesh was placed within the anterior abdominal wall and secured in place using 2-0 Vicryl immediately above the pubic tubercle and superior on the abdominal wall ensuring no vessels or nerves were incorporated.  After noting proper placement of the mesh with the peritoneal reflection deep to it, the previously created peritoneal flap was secured back up to the anterior abdominal wall using running 3-0 V-Lock. No holes were noted in the flap.  All needles were then removed out of the abdominal cavity, Xi platform undocked from the ports and removed off of operative field.  Exparel mixed was infused as ilioinguinal block at both sides and at the port sites.   The abdomen was then desufflated and ports removed. All skin incisions were closed with a subcuticular stitch of Monocryl 4-0. Dermabond was applied. The testis was gently pulled down into its anatomic position in the scrotum.  Final inspection revealed acceptable hemostasis. All counts were correct at the end of the case. The patient was awakened from anesthesia and extubated without complication.  The patient went to the PACU in stable  condition.   Curlene Labrum, MD [facility] [address] [place], [place] [address] [phone] (office)

## 2022-01-16 NOTE — Interval H&P Note (Signed)
History and Physical Interval Note:  01/16/2022 7:26 AM  Seth Robinson  has presented today for surgery, with the diagnosis of BILATERAL INGUINAL HERNIAS.  The various methods of treatment have been discussed with the patient and family. After consideration of risks, benefits and other options for treatment, the patient has consented to  Procedure(s): XI ROBOTIC ASSISTED INGUINAL HERNIA REPAIR WITH MESH, POSSIBLE OPEN (Bilateral) as a surgical intervention.  The patient's history has been reviewed, patient examined, no change in status, stable for surgery.  I have reviewed the patient's chart and labs.  Questions were answered to the patient's satisfaction.    Bilateral repair. No changes  Lucretia Roers

## 2022-01-16 NOTE — Anesthesia Preprocedure Evaluation (Signed)
Anesthesia Evaluation  Patient identified by MRN, date of birth, ID band Patient awake    Reviewed: Allergy & Precautions, H&P , NPO status , Patient's Chart, lab work & pertinent test results, reviewed documented beta blocker date and time   Airway Mallampati: II  TM Distance: >3 FB Neck ROM: full    Dental no notable dental hx.    Pulmonary neg pulmonary ROS   Pulmonary exam normal breath sounds clear to auscultation       Cardiovascular Exercise Tolerance: Good negative cardio ROS  Rhythm:regular Rate:Normal     Neuro/Psych  Headaches  negative psych ROS   GI/Hepatic Neg liver ROS,GERD  Medicated,,  Endo/Other  negative endocrine ROS    Renal/GU negative Renal ROS  negative genitourinary   Musculoskeletal   Abdominal   Peds  Hematology negative hematology ROS (+)   Anesthesia Other Findings   Reproductive/Obstetrics negative OB ROS                             Anesthesia Physical Anesthesia Plan  ASA: 2  Anesthesia Plan: General and General ETT   Post-op Pain Management:    Induction:   PONV Risk Score and Plan: Ondansetron  Airway Management Planned:   Additional Equipment:   Intra-op Plan:   Post-operative Plan:   Informed Consent: I have reviewed the patients History and Physical, chart, labs and discussed the procedure including the risks, benefits and alternatives for the proposed anesthesia with the patient or authorized representative who has indicated his/her understanding and acceptance.     Dental Advisory Given  Plan Discussed with: CRNA  Anesthesia Plan Comments:        Anesthesia Quick Evaluation

## 2022-01-16 NOTE — Anesthesia Procedure Notes (Signed)
Procedure Name: Intubation Date/Time: 01/16/2022 7:41 AM  Performed by: Cy Blamer, CRNAPre-anesthesia Checklist: Patient identified, Emergency Drugs available, Suction available and Patient being monitored Patient Re-evaluated:Patient Re-evaluated prior to induction Oxygen Delivery Method: Circle system utilized Preoxygenation: Pre-oxygenation with 100% oxygen Induction Type: IV induction Ventilation: Oral airway inserted - appropriate to patient size and Two handed mask ventilation required Laryngoscope Size: Glidescope and 4 Grade View: Grade I Tube type: Oral Tube size: 7.5 mm Number of attempts: 1 Airway Equipment and Method: Stylet, Video-laryngoscopy and Oral airway Placement Confirmation: ETT inserted through vocal cords under direct vision, positive ETCO2 and breath sounds checked- equal and bilateral Secured at: 26 cm Tube secured with: Tape Dental Injury: Teeth and Oropharynx as per pre-operative assessment  Difficulty Due To: Difficulty was anticipated and Difficult Airway- due to reduced neck mobility Comments: Elective glidescope for limited AOJ extension

## 2022-01-16 NOTE — Transfer of Care (Signed)
Immediate Anesthesia Transfer of Care Note  Patient: Seth Robinson  Procedure(s) Performed: XI ROBOTIC ASSISTED INGUINAL HERNIA REPAIR WITH MESH, POSSIBLE OPEN (Bilateral: Abdomen)  Patient Location: PACU  Anesthesia Type:General  Level of Consciousness: drowsy, patient cooperative, and responds to stimulation  Airway & Oxygen Therapy: Patient Spontanous Breathing and Patient connected to nasal cannula oxygen  Post-op Assessment: Report given to RN, Post -op Vital signs reviewed and stable, Patient moving all extremities X 4, and Patient able to stick tongue midline  Post vital signs: Reviewed  Last Vitals:  Vitals Value Taken Time  BP 144/96 01/16/22 1053  Temp 98.5   Pulse 99 01/16/22 1055  Resp 20 01/16/22 1055  SpO2 95 % 01/16/22 1055  Vitals shown include unvalidated device data.  Last Pain:  Vitals:   01/16/22 3154  TempSrc: Oral  PainSc: 0-No pain      Patients Stated Pain Goal: 8 (01/16/22 0086)  Complications:  Encounter Notable Events  Notable Event Outcome Phase Comment  Difficult to intubate - expected  Intraprocedure Filed from anesthesia note documentation.

## 2022-01-18 NOTE — Anesthesia Postprocedure Evaluation (Signed)
Anesthesia Post Note  Patient: Seth Robinson  Procedure(s) Performed: XI ROBOTIC ASSISTED INGUINAL HERNIA REPAIR WITH MESH, POSSIBLE OPEN (Bilateral: Abdomen)  Patient location during evaluation: Phase II Anesthesia Type: General Level of consciousness: awake Pain management: pain level controlled Vital Signs Assessment: post-procedure vital signs reviewed and stable Respiratory status: spontaneous breathing and respiratory function stable Cardiovascular status: blood pressure returned to baseline and stable Postop Assessment: no headache and no apparent nausea or vomiting Anesthetic complications: yes Comments: Late entry   Encounter Notable Events  Notable Event Outcome Phase Comment  Difficult to intubate - expected  Intraprocedure Filed from anesthesia note documentation.     Last Vitals:  Vitals:   01/16/22 1145 01/16/22 1200  BP: 128/75 126/81  Pulse: (!) 101 (!) 104  Resp: 13 12  Temp:    SpO2: 94% 95%    Last Pain:  Vitals:   01/16/22 1200  TempSrc:   PainSc: 4                  Windell Norfolk

## 2022-01-29 ENCOUNTER — Encounter (HOSPITAL_COMMUNITY): Payer: Self-pay | Admitting: General Surgery

## 2022-02-14 ENCOUNTER — Encounter: Payer: Self-pay | Admitting: General Surgery

## 2022-02-14 ENCOUNTER — Ambulatory Visit (INDEPENDENT_AMBULATORY_CARE_PROVIDER_SITE_OTHER): Payer: 59 | Admitting: General Surgery

## 2022-02-14 VITALS — BP 129/85 | HR 70 | Temp 98.7°F | Resp 14 | Ht 70.0 in | Wt 198.0 lb

## 2022-02-14 DIAGNOSIS — K4090 Unilateral inguinal hernia, without obstruction or gangrene, not specified as recurrent: Secondary | ICD-10-CM

## 2022-02-14 NOTE — Patient Instructions (Signed)
Can start to increase activity and lift more weight up to 20 lbs in the next 2 weeks. Then continue to add back after that time. Ok to start doing sits and push ups gradually in the next 2 weeks.

## 2022-02-15 NOTE — Progress Notes (Signed)
[facility]  Doing well after his bilateral laparoscopic robotic assisted inguinal hernia repair done.   Has had minimal pain. Used about 6 pain pills in the first few days.   Overall feeling good. BP 129/85   Pulse 70   Temp 98.7 F (37.1 C) (Oral)   Resp 14   Ht 5\' 10"  (1.778 m)   Wt 198 lb (89.8 kg)   SpO2 95%   BMI 28.41 kg/m  Incisions healing with no erythema or drainage No signs of hernia recurrence   Patient s/p bilateral laparoscopic robotic assisted inguinal hernia with mesh. Doing well.   Can start to increase activity and lift more weight up to 20 lbs in the next 2 weeks. Then continue to add back after that time. Ok to start doing sits and push ups gradually in the next 2 weeks.  Curlene Labrum, MD [facility] [address] [place],  [address] [phone] (office)

## 2023-06-13 ENCOUNTER — Encounter: Payer: Self-pay | Admitting: Internal Medicine

## 2023-07-22 ENCOUNTER — Ambulatory Visit (AMBULATORY_SURGERY_CENTER)

## 2023-07-22 ENCOUNTER — Encounter: Payer: Self-pay | Admitting: Internal Medicine

## 2023-07-22 VITALS — Ht 70.0 in | Wt 195.0 lb

## 2023-07-22 DIAGNOSIS — Z1211 Encounter for screening for malignant neoplasm of colon: Secondary | ICD-10-CM

## 2023-07-22 MED ORDER — NA SULFATE-K SULFATE-MG SULF 17.5-3.13-1.6 GM/177ML PO SOLN: 1.0000 | Freq: Once | ORAL | 0 refills | Status: AC

## 2023-07-22 NOTE — Progress Notes (Signed)
 No egg or soy allergy known to patient  No issues known to pt with past sedation with any surgeries or procedures Patient denies ever being told they had issues or difficulty with intubation  No FH of Malignant Hyperthermia Pt is not on diet pills nor GLP-1 medications Pt is not on home 02  Pt is not on blood thinners  Pt denies issues with constipation  No A fib or A flutter Have any cardiac testing pending--no Pt instructed to use Singlecare.com or GoodRx for a price reduction on prep  Ambulates independently Seth Schillings, CRNA has reviewed chart

## 2023-08-03 ENCOUNTER — Encounter: Payer: Self-pay | Admitting: Internal Medicine

## 2023-08-03 ENCOUNTER — Telehealth: Payer: Self-pay | Admitting: *Deleted

## 2023-08-03 NOTE — Telephone Encounter (Signed)
 Dr. Federico,  This pt is scheduled with you on July 11.  He is a documented difficult intubation and his procedure will need to be done at the hospital.   Best Regards,  Norleen EMERSON Schillings

## 2023-08-08 ENCOUNTER — Encounter: Admitting: Internal Medicine

## 2023-08-22 ENCOUNTER — Ambulatory Visit: Admitting: Internal Medicine

## 2023-08-22 ENCOUNTER — Encounter: Payer: Self-pay | Admitting: Internal Medicine

## 2023-08-22 VITALS — BP 120/82 | HR 67 | Ht 70.0 in | Wt 201.0 lb

## 2023-08-22 DIAGNOSIS — Z01818 Encounter for other preprocedural examination: Secondary | ICD-10-CM

## 2023-08-22 DIAGNOSIS — Z1211 Encounter for screening for malignant neoplasm of colon: Secondary | ICD-10-CM

## 2023-08-22 DIAGNOSIS — T884XXD Failed or difficult intubation, subsequent encounter: Secondary | ICD-10-CM

## 2023-08-22 MED ORDER — NA SULFATE-K SULFATE-MG SULF 17.5-3.13-1.6 GM/177ML PO SOLN: ORAL | 0 refills | Status: AC

## 2023-08-22 NOTE — Patient Instructions (Addendum)
 You have been scheduled for a colonoscopy. Please follow written instructions given to you at your visit today.   If you use inhalers (even only as needed), please bring them with you on the day of your procedure.  DO NOT TAKE 7 DAYS PRIOR TO TEST- Trulicity (dulaglutide) Ozempic, Wegovy (semaglutide) Mounjaro (tirzepatide) Bydureon Bcise (exanatide extended release)  DO NOT TAKE 1 DAY PRIOR TO YOUR TEST Rybelsus (semaglutide) Adlyxin (lixisenatide) Victoza (liraglutide) Byetta (exanatide) ___________________________________________________________________________  We have sent the following medications to your pharmacy for you to pick up at your convenience: Suprep  _______________________________________________________  If your blood pressure at your visit was 140/90 or greater, please contact your primary care physician to follow up on this.  _______________________________________________________  If you are age 64 or older, your body mass index should be between 23-30. Your Body mass index is 28.84 kg/m. If this is out of the aforementioned range listed, please consider follow up with your Primary Care Provider.  If you are age 64 or younger, your body mass index should be between 19-25. Your Body mass index is 28.84 kg/m. If this is out of the aformentioned range listed, please consider follow up with your Primary Care Provider.   ________________________________________________________  The [facility] providers would like to encourage you to use MYCHART to communicate with providers for non-urgent requests or questions.  Due to long hold times on the telephone, sending your provider a message by [facility] may be a faster and more efficient way to get a response.  Please allow 48 business hours for a response.  Please remember that this is for non-urgent requests.  _______________________________________________________  [facility] is using a team-based approach  to care.  Your team is made up of your doctor and two to three APPS. Our APPS (Nurse Practitioners and Physician Assistants) work with your physician to ensure care continuity for you. They are fully qualified to address your health concerns and develop a treatment plan. They communicate directly with your gastroenterologist to care for you. Seeing the Advanced Practice Practitioners on your physician's team can help you by facilitating care more promptly, often allowing for earlier appointments, access to diagnostic testing, procedures, and other specialty referrals.   Due to recent changes in healthcare laws, you may see the results of your imaging and laboratory studies on MyChart before your provider has had a chance to review them.  We understand that in some cases there may be results that are confusing or concerning to you. Not all laboratory results come back in the same time frame and the provider may be waiting for multiple results in order to interpret others.  Please give us  48 hours in order for your provider to thoroughly review all the results before contacting the office for clarification of your results.

## 2023-08-22 NOTE — Progress Notes (Signed)
 Chief Complaint: Colon cancer screening  HPI : 63 year old male with history of difficult airway, GERD, and arthritis presents to discuss getting a colonoscopy for colon cancer screening  Denies blood in the stools, diarrhea, change in bowel habits, or unintentional weight loss. He does have some constipation on occasion depending on what he eats. Denies family history of colon cancer. Denies ab pain. Denies N&V or dysphagia. He has acid reflux depending on what he eats for which he will take a Pepcid PRN. Last colonoscopy in 2015 showed only diverticulosis and hemorrhoids  Wt Readings from Last 3 Encounters:  08/22/23 201 lb (91.2 kg)  07/22/23 195 lb (88.5 kg)  02/14/22 198 lb (89.8 kg)    Past Medical History:  Diagnosis Date   Allergy    Arthritis    Difficult airway for intubation 01/16/2022   Difficulty was anticipated, Difficult airway - due to reduced neck mobility; Grade 1, Stylet, Video Laryngoscope   Frequent headaches    GERD (gastroesophageal reflux disease)    Migraines    Seasonal allergies      Past Surgical History:  Procedure Laterality Date   ADENOIDECTOMY  01/29/1968   CERVICAL DISCECTOMY  01/29/2007   c5-c6,c6-c7   COLONOSCOPY  2015   SHOULDER ARTHROSCOPY WITH BICEPS TENDON REPAIR Right    XI ROBOTIC ASSISTED INGUINAL HERNIA REPAIR WITH MESH Bilateral 01/16/2022   Procedure: XI ROBOTIC ASSISTED INGUINAL HERNIA REPAIR WITH MESH, POSSIBLE OPEN;  Surgeon: Kallie Manuelita BROCKS, MD;  Location: [facility];  Service: General;  Laterality: Bilateral;   Family History  Problem Relation Age of Onset   Colon polyps Mother    Prostate cancer Father    Testicular cancer Father    Kidney disease Paternal Uncle    Lung cancer Maternal Grandfather    Heart disease Maternal Grandfather    Colon cancer Neg Hx    Esophageal cancer Neg Hx    Rectal cancer Neg Hx    Stomach cancer Neg Hx    Social History   Tobacco Use   Smoking status: Former    Types: Cigars    Smokeless tobacco: Never  Vaping Use   Vaping status: Never Used  Substance Use Topics   Alcohol use: Yes    Alcohol/week: 5.0 standard drinks of alcohol    Types: 5 Shots of liquor per week    Comment: Bourbon   Drug use: No   Current Outpatient Medications  Medication Sig Dispense Refill   acetaminophen  (TYLENOL ) 650 MG CR tablet Take 650 mg by mouth once as needed for pain.     aspirin-acetaminophen -caffeine (EXCEDRIN MIGRAINE) 250-250-65 MG tablet Take 2 tablets by mouth once as needed for headache.     calcium carbonate (SUPER CALCIUM) 1500 (600 Ca) MG TABS tablet Take 600 mg of elemental calcium by mouth daily with breakfast.     Cholecalciferol (VITAMIN D3) 100000 UNIT/GM POWD 5,000 tab     famotidine (PEPCID) 20 MG tablet Take 20 mg by mouth daily.     Magnesium 400 MG CAPS Take 400 mg by mouth daily.     Misc Natural Products (NEURIVA PO) Take by mouth.     Multiple Vitamins-Minerals (CENTRUM SILVER 50+MEN) TABS Take 1 tablet by mouth daily.     OVER THE COUNTER MEDICATION Place 1 spray into alternate nostrils once as needed (as needed for nasal congestion).     polyethylene glycol (MIRALAX / GLYCOLAX) 17 g packet Take 17 g by mouth once as needed for mild  constipation.     Potassium 99 MG TABS daily as needed.     Probiotic Product (PROBIOTIC GUMMIES PO) Take 1 tablet by mouth daily.     saw palmetto 500 MG capsule Take 500 mg by mouth daily.     sodium chloride  (OCEAN) 0.65 % SOLN nasal spray Place 1 spray into both nostrils in the morning.     TURMERIC PO Take 1 capsule by mouth daily.     No current facility-administered medications for this visit.   No Known Allergies   Review of Systems: All systems reviewed and negative except where noted in HPI.   Physical Exam: BP 120/82   Pulse 67   Ht 5' 10 (1.778 m)   Wt 201 lb (91.2 kg)   BMI 28.84 kg/m  Constitutional: Pleasant,well-developed, male in no acute distress. HEENT: Normocephalic and atraumatic.  Conjunctivae are normal. No scleral icterus. Cardiovascular: Normal rate, regular rhythm.  Pulmonary/chest: Effort normal and breath sounds normal. No wheezing, rales or rhonchi. Abdominal: Soft, nondistended, nontender. Bowel sounds active throughout. There are no masses palpable. No hepatomegaly. Extremities: No edema Neurological: Alert and oriented to person place and time. Skin: Skin is warm and dry. No rashes noted. Psychiatric: Normal mood and affect. Behavior is normal.  Labs 12/2021: CBC nml.   Colonoscopy 05/12/13:   ASSESSMENT AND PLAN: Colon cancer screening Difficult airway Patient presents to discuss getting a colonoscopy for colon cancer screening.  He was previously labeled as a difficult airway based upon his inguinal hernia repair that was done in 2023.  Thus his procedure will have to be done at the hospital.  I went over the risks and benefits of the colonoscopy procedure with the patient versus doing a Cologuard test for colon cancer screening since he has normal risk.  Patient would prefer to do a colonoscopy so we will go ahead and get him scheduled at the hospital. - Colonoscopy [facility] on 9/2 due to difficult airway  Estefana Kidney, MD  I spent 38 minutes of time, including in depth chart review, independent review of results as outlined above, communicating results with the patient directly, face-to-face time with the patient, coordinating care, ordering studies and medications as appropriate, and documentation.

## 2023-09-18 ENCOUNTER — Encounter (HOSPITAL_COMMUNITY): Payer: Self-pay | Admitting: Internal Medicine

## 2023-09-18 ENCOUNTER — Other Ambulatory Visit: Payer: Self-pay

## 2023-09-18 NOTE — Progress Notes (Addendum)
 PCP - Dr Larnell Mosses medical  Cardiologist - no  PPM/ICD -  Device Orders -  Rep Notified -   Chest x-ray -  EKG -  Stress Test -  ECHO -  Cardiac Cath -   Sleep Study - n/a CPAP -   Fasting Blood Sugar -  Checks Blood Sugar __n/a___ times a day  Blood Thinner Instructions:n/a Aspirin Instructions:n/a   Activity--Able to climb a flight of stairs without CP or SOB  Anesthesia review:   Patient denies shortness of breath, fever, cough and chest pain at PAT appointment   All instructions explained to the patient, with a verbal understanding of the material. Patient agrees to go over the instructions while at home for a better understanding. Patient also instructed to self quarantine after being tested for COVID-19. The opportunity to ask questions was provided.

## 2023-09-25 ENCOUNTER — Telehealth: Payer: Self-pay

## 2023-09-25 NOTE — Telephone Encounter (Signed)
 Procedure:COLON Procedure date: 09/30/23 Procedure location: [facility] Arrival Time: 10:15 Spoke with the patient Y/N: Y Any prep concerns? N  Has the patient obtained the prep from the pharmacy ? Y Do you have a care partner and transportation: Y Any additional concerns? N

## 2023-09-30 ENCOUNTER — Encounter (HOSPITAL_COMMUNITY): Payer: Self-pay | Admitting: Internal Medicine

## 2023-09-30 ENCOUNTER — Encounter (HOSPITAL_COMMUNITY)
Admission: RE | Disposition: A | Discharge status: Home / Self Care | Payer: Self-pay | Source: Home / Self Care | Attending: Internal Medicine

## 2023-09-30 ENCOUNTER — Other Ambulatory Visit: Payer: Self-pay

## 2023-09-30 ENCOUNTER — Ambulatory Visit (HOSPITAL_BASED_OUTPATIENT_CLINIC_OR_DEPARTMENT_OTHER): Payer: Self-pay | Admitting: Physician Assistant

## 2023-09-30 ENCOUNTER — Ambulatory Visit (HOSPITAL_COMMUNITY): Payer: Self-pay | Admitting: Physician Assistant

## 2023-09-30 ENCOUNTER — Ambulatory Visit (HOSPITAL_COMMUNITY)
Admission: RE | Admit: 2023-09-30 | Discharge: 2023-09-30 | Disposition: A | Discharge status: Home / Self Care | Attending: Internal Medicine | Admitting: Internal Medicine

## 2023-09-30 DIAGNOSIS — D123 Benign neoplasm of transverse colon: Secondary | ICD-10-CM | POA: Insufficient documentation

## 2023-09-30 DIAGNOSIS — K635 Polyp of colon: Secondary | ICD-10-CM

## 2023-09-30 DIAGNOSIS — D122 Benign neoplasm of ascending colon: Secondary | ICD-10-CM | POA: Insufficient documentation

## 2023-09-30 DIAGNOSIS — K5730 Diverticulosis of large intestine without perforation or abscess without bleeding: Secondary | ICD-10-CM | POA: Insufficient documentation

## 2023-09-30 DIAGNOSIS — Z1211 Encounter for screening for malignant neoplasm of colon: Secondary | ICD-10-CM

## 2023-09-30 DIAGNOSIS — K648 Other hemorrhoids: Secondary | ICD-10-CM | POA: Diagnosis not present

## 2023-09-30 DIAGNOSIS — Z87891 Personal history of nicotine dependence: Secondary | ICD-10-CM | POA: Diagnosis not present

## 2023-09-30 DIAGNOSIS — G43909 Migraine, unspecified, not intractable, without status migrainosus: Secondary | ICD-10-CM | POA: Diagnosis not present

## 2023-09-30 HISTORY — DX: Failed or difficult intubation, initial encounter: T88.4XXA

## 2023-09-30 HISTORY — PX: COLONOSCOPY: SHX5424

## 2023-09-30 HISTORY — PX: POLYPECTOMY: SHX149

## 2023-09-30 HISTORY — DX: Pneumonia, unspecified organism: J18.9

## 2023-09-30 SURGERY — COLONOSCOPY
Anesthesia: Monitor Anesthesia Care

## 2023-09-30 MED ORDER — PROPOFOL 10 MG/ML IV BOLUS
INTRAVENOUS | Status: DC | PRN
  Administered 2023-09-30: 10 mg via INTRAVENOUS
  Administered 2023-09-30: 30 mg via INTRAVENOUS
  Administered 2023-09-30 (×4): 20 mg via INTRAVENOUS

## 2023-09-30 MED ORDER — LACTATED RINGERS IV SOLN
INTRAVENOUS | Status: AC | PRN
  Administered 2023-09-30: 1000 mL via INTRAVENOUS

## 2023-09-30 MED ORDER — SODIUM CHLORIDE 0.9 % IV SOLN: INTRAVENOUS | Status: DC

## 2023-09-30 MED ORDER — PROPOFOL 500 MG/50ML IV EMUL
INTRAVENOUS | Status: DC | PRN
  Administered 2023-09-30: 150 ug/kg/min via INTRAVENOUS

## 2023-09-30 NOTE — Transfer of Care (Signed)
 Immediate Anesthesia Transfer of Care Note  Patient: Seth Robinson  Procedure(s) Performed: COLONOSCOPY POLYPECTOMY, INTESTINE  Patient Location: PACU  Anesthesia Type:MAC  Level of Consciousness: sedated  Airway & Oxygen Therapy: Patient Spontanous Breathing and Patient connected to face mask oxygen  Post-op Assessment: Report given to RN and Post -op Vital signs reviewed and stable  Post vital signs: Reviewed and stable  Last Vitals:  Vitals Value Taken Time  BP    Temp    Pulse    Resp    SpO2      Last Pain:  Vitals:   09/30/23 1044  TempSrc: Temporal  PainSc: 0-No pain      Patients Stated Pain Goal: 0 (09/30/23 1044)  Complications: No notable events documented.

## 2023-09-30 NOTE — Anesthesia Preprocedure Evaluation (Addendum)
 Anesthesia Evaluation  Patient identified by MRN, date of birth, ID band Patient awake    Reviewed: Allergy & Precautions, NPO status , Patient's Chart, lab work & pertinent test results  Airway Mallampati: III  TM Distance: >3 FB Neck ROM: Limited    Dental no notable dental hx.    Pulmonary former smoker   Pulmonary exam normal        Cardiovascular Normal cardiovascular exam     Neuro/Psych    GI/Hepatic   Endo/Other    Renal/GU      Musculoskeletal  (+) Arthritis ,    Abdominal   Peds  Hematology negative hematology ROS (+)   Anesthesia Other Findings colon cancer screening  Reproductive/Obstetrics                              Anesthesia Physical Anesthesia Plan  ASA: 2  Anesthesia Plan: MAC   Post-op Pain Management:    Induction:   PONV Risk Score and Plan: 1 and Propofol  infusion and Treatment may vary due to age or medical condition  Airway Management Planned: Simple Face Mask  Additional Equipment:   Intra-op Plan:   Post-operative Plan:   Informed Consent: I have reviewed the patients History and Physical, chart, labs and discussed the procedure including the risks, benefits and alternatives for the proposed anesthesia with the patient or authorized representative who has indicated his/her understanding and acceptance.     Dental advisory given  Plan Discussed with: CRNA  Anesthesia Plan Comments:          Anesthesia Quick Evaluation

## 2023-09-30 NOTE — Op Note (Signed)
 [facility] Patient Name: Seth Robinson Procedure Date: 09/30/2023 MRN: [id number] Attending MD: Rosario Estefana Kidney , , [id number] Date of Birth: Oct 23, 1960 CSN: [id number] Age: 63 Admit Type: Outpatient Procedure:                Colonoscopy Indications:              Screening for colorectal malignant neoplasm Providers:                Rosario Estefana Kidney Gregoria Odean, RN,                            Curtistine Bishop, Technician, Corene Southgate, Technician Referring MD:             Glendia Freeman Medicines:                Monitored Anesthesia Care Complications:            No immediate complications. Estimated Blood Loss:     Estimated blood loss was minimal. Procedure:                Pre-Anesthesia Assessment:                           - Prior to the procedure, a History and Physical                            was performed, and patient medications and                            allergies were reviewed. The patient's tolerance of                            previous anesthesia was also reviewed. The risks                            and benefits of the procedure and the sedation                            options and risks were discussed with the patient.                            All questions were answered, and informed consent                            was obtained. Prior Anticoagulants: The patient has                            taken no anticoagulant or antiplatelet agents. ASA                            Grade Assessment: II - A patient with mild systemic                            disease. After reviewing the risks and benefits,  the patient was deemed in satisfactory condition to                            undergo the procedure.                           After obtaining informed consent, the colonoscope                            was passed under direct vision. Throughout the                            procedure, the patient's blood  pressure, pulse, and                            oxygen saturations were monitored continuously. The                            CF-HQ190L ([id number]) Olympus colonoscope was                            introduced through the anus and advanced to the the                            terminal ileum. The colonoscopy was performed                            without difficulty. The patient tolerated the                            procedure well. The quality of the bowel                            preparation was excellent. The terminal ileum,                            ileocecal valve, appendiceal orifice, and rectum                            were photographed. Scope In: 11:25:20 AM Scope Out: 11:44:19 AM Scope Withdrawal Time: 0 hours 11 minutes 39 seconds  Total Procedure Duration: 0 hours 18 minutes 59 seconds  Findings:      The terminal ileum appeared normal.      Two sessile polyps were found in the transverse colon and ascending       colon. The polyps were 3 to 4 mm in size. These polyps were removed with       a cold snare. Resection and retrieval were complete.      Multiple diverticula were found in the sigmoid colon and descending       colon.      Non-bleeding internal hemorrhoids were found during retroflexion. Impression:               - The examined portion of the ileum was normal.                           -  Two 3 to 4 mm polyps in the transverse colon and                            in the ascending colon, removed with a cold snare.                            Resected and retrieved.                           - Diverticulosis in the sigmoid colon and in the                            descending colon.                           - Non-bleeding internal hemorrhoids. Moderate Sedation:      Not Applicable - Patient had care per Anesthesia. Recommendation:           - Discharge patient to home (with escort).                           - Await pathology results.                            - The findings and recommendations were discussed                            with the patient. Procedure Code(s):        --- Professional ---                           [id number], Colonoscopy, flexible; with removal of                            tumor(s), polyp(s), or other lesion(s) by snare                            technique Diagnosis Code(s):        --- Professional ---                           Z12.11, Encounter for screening for malignant                            neoplasm of colon                           K64.8, Other hemorrhoids                           D12.3, Benign neoplasm of transverse colon (hepatic                            flexure or splenic flexure)                           D12.2, Benign neoplasm  of ascending colon                           K57.30, Diverticulosis of large intestine without                            perforation or abscess without bleeding CPT copyright 2022 American Medical Association. All rights reserved. The codes documented in this report are preliminary and upon coder review may  be revised to meet current compliance requirements. Dr [name] Rosario [name],  09/30/2023 11:48:58 AM Number of Addenda: 0

## 2023-09-30 NOTE — Discharge Instructions (Signed)

## 2023-09-30 NOTE — H&P (Signed)
 GASTROENTEROLOGY PROCEDURE H&P NOTE   Primary Care Physician: Larnell Hamilton, MD    Reason for Procedure:   Colon cancer screening  Plan:    Colonoscopy  Patient is appropriate for endoscopic procedure(s) in the ambulatory (hospital) setting.  The nature of the procedure, as well as the risks, benefits, and alternatives were carefully and thoroughly reviewed with the patient. Ample time for discussion and questions allowed. The patient understood, was satisfied, and agreed to proceed.     HPI: Seth Robinson is a 63 y.o. male who presents for colonoscopy for evaluation of colon cancer screening.  Patient was most recently seen in the Gastroenterology Clinic on 08/22/23.  No interval change in medical history since that appointment. Please refer to that note for full details regarding GI history and clinical presentation.   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 unaware dicectomy 2009 2 fused disc and titanium plate in neck. Limited neck movement   GERD (gastroesophageal reflux disease)    Migraines    Pneumonia    Seasonal allergies     Past Surgical History:  Procedure Laterality Date   ADENOIDECTOMY  01/29/1968   CERVICAL DISCECTOMY  01/29/2007   c5-c6,c6-c7   COLONOSCOPY  2015   SHOULDER ARTHROSCOPY WITH BICEPS TENDON REPAIR Right    XI ROBOTIC ASSISTED INGUINAL HERNIA REPAIR WITH MESH Bilateral 01/16/2022   Procedure: XI ROBOTIC ASSISTED INGUINAL HERNIA REPAIR WITH MESH, POSSIBLE OPEN;  Surgeon: Kallie Manuelita BROCKS, MD;  Location: [facility];  Service: General;  Laterality: Bilateral;    Prior to Admission medications   Medication Sig Start Date End Date Taking? Authorizing Provider  aspirin-acetaminophen -caffeine (EXCEDRIN MIGRAINE) 250-250-65 MG tablet  Take 2 tablets by mouth once as needed for headache.   Yes [provider]  Na Sulfate-K Sulfate-Mg Sulfate concentrate (SUPREP) 17.5-3.13-1.6 GM/177ML SOLN Use as directed; may use generic; goodrx card if insurance will not cover generic 08/22/23  Yes Federico Rosario BROCKS, MD  polyethylene glycol (MIRALAX / GLYCOLAX) 17 g packet Take 17 g by mouth once as needed for mild constipation.   Yes [provider]  sodium chloride  (OCEAN) 0.65 % SOLN nasal spray Place 1 spray into both nostrils in the morning.   Yes [provider]  acetaminophen  (TYLENOL ) 650 MG CR tablet Take 650 mg by mouth once as needed for pain.    [provider]  calcium carbonate (SUPER CALCIUM) 1500 (600 Ca) MG TABS tablet Take 600 mg of elemental calcium by mouth daily with breakfast.    [provider]  Cholecalciferol (VITAMIN D3) 100000 UNIT/GM POWD 5,000 tab    [provider]  famotidine (PEPCID) 20 MG tablet Take 20 mg by mouth daily.    [provider]  Magnesium 400 MG CAPS Take 400 mg by mouth daily.    [provider]  Misc Natural Products (NEURIVA PO) Take by mouth.    [provider]  Multiple Vitamins-Minerals (CENTRUM SILVER 50+MEN) TABS Take 1 tablet by mouth daily.    [provider]  OVER THE COUNTER MEDICATION Place 1 spray into alternate nostrils once as needed (as needed for nasal congestion).    [provider]  Potassium 99 MG TABS daily as needed.    [provider]  Probiotic Product (PROBIOTIC GUMMIES PO) Take 1 tablet by mouth daily.    [provider]  saw palmetto 500 MG capsule Take 500 mg by mouth daily.    [provider]  TURMERIC PO Take 1 capsule by mouth daily.    [provider]    Current Facility-Administered Medications  Medication Dose Route Frequency Provider Last Rate Last Admin   0.9 %  sodium chloride  infusion   Intravenous Continuous Federico Rosario BROCKS, MD        lactated ringers  infusion    Continuous PRN Federico Rosario BROCKS, MD 20 mL/hr at 09/30/23 1048 1,000 mL at 09/30/23 1048    Allergies as of 08/22/2023   (No Known Allergies)    Family History  Problem Relation Age of Onset   Colon polyps Mother    Prostate cancer Father    Testicular cancer Father    Kidney disease Paternal Uncle    Lung cancer Maternal Grandfather    Heart disease Maternal Grandfather    Colon cancer Neg Hx    Esophageal cancer Neg Hx    Rectal cancer Neg Hx    Stomach cancer Neg Hx     Social History   Socioeconomic History   Marital status: Married    Spouse name: Not on file   Number of children: Not on file   Years of education: Not on file   Highest education level: Not on file  Occupational History   Not on file  Tobacco Use   Smoking status: Former    Types: Cigars   Smokeless tobacco: Never   Tobacco comments:    None since 2007  Vaping Use   Vaping status: Never Used  Substance and Sexual Activity   Alcohol use: Yes    Alcohol/week: 5.0 standard drinks of alcohol    Types: 5 Shots of liquor per week    Comment: Bourbon   Drug use: Never   Sexual activity: Not Currently  Other Topics Concern   Not on file  Social History Narrative   Work or School: retired Building services engineer Situation: lives with wife      Spiritual Beliefs: christian      Lifestyle: runs when warm; diet ok            Social Drivers of Corporate investment banker Strain: Not on file  Food Insecurity: Not on file  Transportation Needs: Not on file  Physical Activity: Not on file  Stress: Not on file  Social Connections: Not on file  Intimate Partner Violence: Not on file    Physical Exam: Vital signs in last 24 hours: BP (!) 171/81   Temp 97.9 F (36.6 C) (Temporal)   Resp 19   Ht 5' 10 (1.778 m)   Wt 87.3 kg   SpO2 96%   BMI 27.61 kg/m  GEN: NAD EYE: Sclerae anicteric ENT: MMM CV: Non-tachycardic Pulm: No increased WOB GI:  Soft NEURO:  Alert & Oriented   [name], MD [facility]   09/30/2023 10:59 AM

## 2023-10-01 NOTE — Anesthesia Postprocedure Evaluation (Signed)
 Anesthesia Post Note  Patient: Seth Robinson  Procedure(s) Performed: COLONOSCOPY POLYPECTOMY, INTESTINE     Patient location during evaluation: Endoscopy Anesthesia Type: MAC Level of consciousness: awake Pain management: pain level controlled Vital Signs Assessment: post-procedure vital signs reviewed and stable Respiratory status: spontaneous breathing, nonlabored ventilation and respiratory function stable Cardiovascular status: blood pressure returned to baseline and stable Postop Assessment: no apparent nausea or vomiting Anesthetic complications: no   No notable events documented.  Last Vitals:  Vitals:   09/30/23 1205 09/30/23 1210  BP:    Pulse: 77 75  Resp: 16 (!) 22  Temp:    SpO2: 95% 96%    Last Pain:  Vitals:   09/30/23 1205  TempSrc:   PainSc: 0-No pain                 Brienna Bass P Dashley Monts

## 2023-10-02 ENCOUNTER — Ambulatory Visit: Payer: Self-pay | Admitting: Internal Medicine

## 2023-10-02 LAB — SURGICAL PATHOLOGY

## 2023-12-18 ENCOUNTER — Other Ambulatory Visit: Payer: Self-pay | Admitting: Family Medicine

## 2023-12-18 ENCOUNTER — Ambulatory Visit (HOSPITAL_BASED_OUTPATIENT_CLINIC_OR_DEPARTMENT_OTHER)
Admission: RE | Admit: 2023-12-18 | Discharge: 2023-12-18 | Disposition: A | Discharge status: Home / Self Care | Source: Ambulatory Visit | Attending: Family Medicine | Admitting: Family Medicine

## 2023-12-18 DIAGNOSIS — R1031 Right lower quadrant pain: Secondary | ICD-10-CM

## 2023-12-18 MED ORDER — IOHEXOL 300 MG/ML  SOLN
100.0000 mL | Freq: Once | INTRAMUSCULAR | Status: AC | PRN
  Administered 2023-12-18: 100 mL via INTRAVENOUS
